# Patient Record
Sex: Female | Born: 1985
Health system: Western US, Academic
[De-identification: ages and names within clinical notes are randomized; demographics above are authoritative.]

---

## 2016-02-07 DIAGNOSIS — R768 Other specified abnormal immunological findings in serum: Secondary | ICD-10-CM

## 2016-03-03 DIAGNOSIS — M359 Systemic involvement of connective tissue, unspecified: Secondary | ICD-10-CM

## 2016-09-25 ENCOUNTER — Telehealth: Payer: BLUE CROSS/BLUE SHIELD

## 2016-10-26 ENCOUNTER — Ambulatory Visit: Payer: BLUE CROSS/BLUE SHIELD

## 2016-10-30 ENCOUNTER — Ambulatory Visit: Payer: BLUE CROSS/BLUE SHIELD

## 2016-11-30 ENCOUNTER — Ambulatory Visit: Payer: BLUE CROSS/BLUE SHIELD | Attending: Rheumatology

## 2016-11-30 DIAGNOSIS — J45990 Exercise induced bronchospasm: Secondary | ICD-10-CM

## 2016-11-30 DIAGNOSIS — E559 Vitamin D deficiency, unspecified: Secondary | ICD-10-CM

## 2016-12-09 ENCOUNTER — Ambulatory Visit: Payer: BLUE CROSS/BLUE SHIELD

## 2016-12-16 ENCOUNTER — Ambulatory Visit: Payer: BLUE CROSS/BLUE SHIELD

## 2016-12-16 DIAGNOSIS — R300 Dysuria: Secondary | ICD-10-CM

## 2016-12-16 MED ORDER — SOLIFENACIN SUCCINATE 5 MG PO TABS
5 mg | ORAL_TABLET | Freq: Every day | ORAL | 3 refills | Status: AC
Start: 2016-12-16 — End: ?

## 2016-12-16 MED ORDER — PHENAZOPYRIDINE HCL 200 MG PO TABS
200 mg | ORAL_TABLET | Freq: Three times a day (TID) | ORAL | 3 refills | Status: AC
Start: 2016-12-16 — End: 2017-06-30

## 2016-12-16 MED ORDER — HYDROXYZINE HCL 25 MG PO TABS
25 mg | ORAL_TABLET | Freq: Every evening | ORAL | 3 refills | Status: AC | PRN
Start: 2016-12-16 — End: ?

## 2017-01-07 ENCOUNTER — Ambulatory Visit: Payer: BLUE CROSS/BLUE SHIELD

## 2017-01-27 ENCOUNTER — Ambulatory Visit: Payer: BLUE CROSS/BLUE SHIELD

## 2017-02-04 ENCOUNTER — Ambulatory Visit: Payer: BLUE CROSS/BLUE SHIELD

## 2017-03-03 ENCOUNTER — Ambulatory Visit: Payer: BLUE CROSS/BLUE SHIELD | Attending: Rheumatology

## 2017-03-05 ENCOUNTER — Ambulatory Visit: Payer: BLUE CROSS/BLUE SHIELD | Attending: Rheumatology

## 2017-03-05 DIAGNOSIS — K219 Gastro-esophageal reflux disease without esophagitis: Secondary | ICD-10-CM

## 2017-03-05 DIAGNOSIS — R300 Dysuria: Secondary | ICD-10-CM

## 2017-03-05 DIAGNOSIS — E559 Vitamin D deficiency, unspecified: Secondary | ICD-10-CM

## 2017-03-05 MED ORDER — HYDROXYCHLOROQUINE SULFATE 200 MG PO TABS
200 mg | ORAL_TABLET | Freq: Every day | ORAL | 2 refills | Status: AC
Start: 2017-03-05 — End: 2017-06-12

## 2017-06-08 ENCOUNTER — Ambulatory Visit: Payer: BLUE CROSS/BLUE SHIELD | Attending: Rheumatology

## 2017-06-08 NOTE — Progress Notes
Chief Complaint: ???Undifferentiated connective tissue disease  ???  This is a ???30 y.o.???female??????Has been pretty good. No side effects from Plaquenil. Macular imaging done September and was normal. No more chest pain that had been reminiscent of 2017. Moving to [place] in March 2019 as husband has taken a job there for 2 to 3 years.     Onset chest pains, generalized pains, carpal tunnel, abdominal pain, bloating in February 2017. Has started gluten-free diet. Had an endoscopy in [facility] in [place] was normal. Saw Dr Beverely Pace who ordered an ultrasound. Dr Beverely Pace has told her she has a hiatal hernia and some reflux. Saw Dr Erle Crocker in August 2017 for positive ANA. Went to physical therapy. It helped. She started 50,000 units vitamin D end of February. She stopped it when it ran out in May 2018. In January, she tried to stop the Plaquenil. Within a month, she noticed more chest pain and resumed it. Plaquenil risks include (including and not limited to) rash, nausea, vomiting, neuromyopathy, and retinal toxicity (eye problems leading to vision loss). Patients should get a???baseline and annual eye exam to evaluate for Plaquenil (also known as hydroxychloroquine) toxicity.    ???  Saw an allergist about 2017. A few environmental allergens were found. Told to take nasal sprays. She gets bouts of nasal discharge. She has tried Claritan OTC and Benadryl at night and does not help.      This year, 2018, developed three UTIs and colds, more than usual for her. She saw a urologist. He said she had cystitis. By the time she saw him, the symptoms were gone. She saw a nephrologist once for lots of bubbles in urine. It was normal.   ???  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 Travels to some remote places.   ???  Lab 11/30/2016  Negative CRP. ESR 9 mm/hr  Normal CBC, CMP except decreased creatinine 0.48   Vitamin D 33 (20-50)  07/27/2016 ???Normal CRP, CBC, CMP, C3, C4. Negative dsDNA, total protein/creat. Vitamin D 15 ???(20-50). ESR 3 mm/hr (<25)   ???  Past Medical History:   Diagnosis Date   ??? Exercise-induced asthma     wheexes when exercises   ??? IBS (irritable bowel syndrome)     onset 07/2015   ??? Insomnia        Past Surgical History:   Procedure Laterality Date   ??? G0P0     ??? wisdom teeth         Social History     Social History   ??? Marital status: Married     Spouse name: N/A   ??? Number of children: N/A   ??? Years of education: N/A     Social History Main Topics   ??? Smoking status: Never Smoker   ??? Smokeless tobacco: Never Used   ??? Alcohol use 2.4 oz/week     4 Glasses of Wine (5 oz) per week      Comment: 2-4 drinks/week   ??? Drug use: Yes      Comment: marijuanna once a month   ??? Sexual activity: Yes      Comment: nuvaring     Other Topics Concern   ??? None     Social History Scientist, water quality    No kids       Family History   Problem Relation Age of Onset   ??? Lymphoma Mother 82   ??? Colon cancer Maternal Aunt 48   ???  Celiac disease Maternal Aunt    ??? Colon cancer Maternal Uncle 55   ??? Celiac disease Cousin    ??? Celiac disease Cousin        No Known Allergies    Current Outpatient Prescriptions   Medication Sig   ??? diclofenac 1% gel Apply 2 g topically four (4) times daily.   ??? hydroxychloroquine (PLAQUENIL) 200 mg tablet Take 1 tablet (200 mg total) by mouth daily.   ??? NUVARING 0.12-0.015 MG/24HR vaginal ring    ??? phenazopyridine (PYRIDIUM) 200 mg tablet Take 1 tablet (200 mg total) by mouth three (3) times daily with meals.   ??? zolpidem 5 mg tablet Take 5 mg by mouth at bedtime as needed for Sleep.   ??? ergocalciferol 50000 units capsule Take 1 capsule (50,000 Units total) by mouth every seven (7) days. (Patient not taking: Reported on 09/15/2016.)     No current facility-administered medications for this visit.        Patient Active Problem List    Diagnosis Date Noted   ??? GERD (gastroesophageal reflux disease) 03/05/2017   ??? Exercise-induced asthma      wheexes when exercises ??? Undifferentiated connective tissue disease (HCC/RAF) 03/03/2016   ??? Costochondral chest pain      Ibuprofen as needed       ??? Wheezing 02/09/2016   ??? ANA positive 02/07/2016       REVIEW OF SYSTEMS:  14 point review of systems other than discussed above or in the rheumatology follow up visit questionnaire was negative.    Vitals:    06/08/17 0919   BP: 94/60   Pulse: 93   Temp: 36.9 ???C (98.4 ???F)   TempSrc: Oral   SpO2: 98%   Weight: 129 lb 9.6 oz (58.8 kg)     21.90 kg/m???  PHYSICAL EXAM:  Was 5' 5''??????at 31 years of age  HEENT: ???PERRLA. Pharynx clear. Normal saliva pool. No adenopathy.  LUNGS: ???Clear to auscultation. No wheezes, rhonchi, or rubs  CARDIO: ??????S1S2 no murmurs, gallops, or rubs.  ABDOMINAL: ???Normal bowel sounds, soft. Nontender. No hepatomegaly.  PULSES: ???Full. No bruits  NEURO: ??????Right handed. Motor 5/5. Normal light touch. DTR symmetrical. Negative Tinel's.   MUSCULOSKELETAL: ???No active synovitis  Skin: ???Tattoos none. No nail changes.  ???  ???  ASSESSMENT & PLAN  1. Exercise-induced asthma  Care per PCP  ???  2. Undifferentiated connective tissue disease  Printed patient information on Plaquenil on 11/30/2016.   Draw blood today and one week before return visit  See me 3 months  ???  3. Vitamin D deficiency  Resume vitamin D  ???  4. GERD  See Dr Beverely Pace if symptoms worsen  ???  25 minutes were spent with the patient. Greater than 50% of the office visit time was devoted to counseling the patient, reviewing previous tests, discussing treatment options, and developing a follow up plan. The above plan of care, diagnosis, orders, and follow-up were discussed with the patient.??????Questions related to this recommended plan of care were answered.  ???  ???

## 2017-06-10 ENCOUNTER — Telehealth: Payer: BLUE CROSS/BLUE SHIELD

## 2017-06-10 NOTE — Telephone Encounter
Please let patient know her labs are pretty good. Continue current meds without change and have a happy holiday. Thanks

## 2017-06-11 MED ORDER — HYDROXYCHLOROQUINE SULFATE 200 MG PO TABS
ORAL_TABLET | 2 refills | Status: AC
Start: 2017-06-11 — End: 2017-08-21

## 2017-06-11 NOTE — Telephone Encounter
I spoke with pt, pt understood and agreed.     Thank you    eh

## 2017-06-28 ENCOUNTER — Ambulatory Visit: Payer: BLUE CROSS/BLUE SHIELD | Attending: Cardiovascular Disease

## 2017-06-28 NOTE — Progress Notes
Referring MD: Donna Bernard, MD   HPI:   Felicia Kennedy is a 32 y.o. female  has a past medical history of Exercise-induced asthma; IBS (irritable bowel syndrome); and Insomnia.. The patient is referred for chest pain.  Prior cardiac history: None  Has had intermittent chest pain since 07/2015. Feels ''deep'' but also has pain she can reproduce with palpation.  Notes occasional wheezing at night with expiration that can last all night- high pitched sound.  Notes wheezing with exercise as well- saw a pulmonologist who said she didn't have asthma. Able to continue exercise- can run 3 miles without stopping.    Started plaquenil 1 month ago.  Significantly improved chest pain since then. Now symptoms last an hour, rated 5/10, located in center of chest and right sided chest wall pain.   Notes need to take a deep breath occasionally.  Exercise Capacity: 2-3 times per week- runs, hikes, yoga    No paroxysmal nocturnal dyspnea or orthopnea.  No lower extremity edema.    INTERVAL HISTORY 06/29/2017:  Here for follow-up.  Has occasional chest pain when she has flare of UCTD. Feels like aching or stabbing sensation for a couple seconds at a time. No identifiable triggers. Runs 35 minutes at a time without chest pain. Has exercise induced asthma with cough and wheezing.  Following gluten free diet.     ROS: 14 point review of systems is negative for pertinent findings other than as stated in HPI    HOME MEDICATIONS:  Medications that the patient states to be currently taking   Medication Sig   ??? ergocalciferol 50000 units capsule Take 1 capsule (50,000 Units total) by mouth every seven (7) days.   ??? HYDROXYCHLOROQUINE 200 mg tablet TAKE 1 TABLET BY MOUTH EVERY DAY   ??? NUVARING 0.12-0.015 MG/24HR vaginal ring    ??? zolpidem 5 mg tablet Take 5 mg by mouth at bedtime as needed for Sleep.       ALLERGIES:  No Known Allergies    Past Medical History:   Diagnosis Date   ??? Exercise-induced asthma     wheexes when exercises ??? IBS (irritable bowel syndrome)     onset 07/2015   ??? Insomnia         Past Surgical History:   Procedure Laterality Date   ??? G0P0     ??? wisdom teeth          SOCIAL HISTORY  History   Smoking Status   ??? Never Smoker   Smokeless Tobacco   ??? Never Used      History   Alcohol Use   ??? 2.4 oz/week   ??? 4 Glasses of Wine (5 oz) per week     Comment: 2-4 drinks/week     History   Drug Use     Comment: marijuanna once a month     Marital status: married  Occupation: Engineer, agricultural    FAMILY HISTORY:  Premature CAD: no  Sudden Cardiac Death: no    PHYSICAL EXAM:  Vitals: BP 124/74  ~ Pulse 92  ~ Ht 5' 4.5'' (1.638 m)  ~ Wt 132 lb (59.9 kg)  ~ SpO2 99%  ~ BMI 22.31 kg/m???   ??? General appearance ??? well developed, well nourished, no acute distress  ??? Eyes - conjunctivae and lids within normal limits  ??? Ears, Nose, Mouth, Throat - no mucosal lesions, no cyanosis  ??? Neck - JVD not elevated  ??? Cardiovascular  ??? Palpation - PMI not  displaced, no thrills  ??? Regular rate and rhythm. Normal S1, S2. No S3 or S4 gallops. No murmurs.  ??? Carotid arteries ??? normal upstroke, no bruits  ??? Abdominal aorta ???difficult to palpate, no bruits  ??? Femoral arteries - 2+ pulses, no bruits  ??? 2+ dorsalis pedis pulses  ??? Respiratory - no respiratory distress, clear to auscultation bilaterally  ??? GI - soft, nontender, no masses, no hepatosplenomegaly, normoactive bowel sounds  ??? Musculoskeletal - grossly normal motor tone  ??? Extremities - no clubbing or cyanosis; no edema  ??? Skin - no rashes  ??? Neurological/Psychiatric - oriented x 3, normal mood and affect    PRIOR CARDIAC STUDIES:  Echocardiogram 03/2016:  IMPRESSION:   1. Technically difficult study.   2. Normal left ventricular size.   3. Left ventricular ejection fraction is approximately 55 to 60%.   4. Normal LV diastolic function    Stress echocardiogram 07/2016:  STRESS EXAM DESCRIPTION:  Stress echocardiogram was performed using the Accelerated Bruce Protocol. The patient exercised for 7 min and 02 sec to stage III, achieving 10.1 METs.  The peak heart rate achieved was 176 bpm, which was 93 % of the age predicted max heart rate. The peak blood pressure during stress was 139/63 mmHg. The double product achieved was 21308.     BASELINE:  The patient's baseline blood pressure was normal. The resting blood pressure was 94/60 mmHg.The resting heart rate was 77 beats per minute. The baseline rhythm was normal sinus rhythm. There were no arrhythmias. Non-specific ST segment and T wave   abnormalities were noted at baseline.     ADDITIONAL BASELINE FINDINGS:  LEFT VENTRICLE: Normal left ventricular size. Global left ventricular systolic function is normal (LVEF 60-65%). Global systolic function is normal, LVEF 60 to 65%.  STRESS:  The patient experienced no significant symptoms. The primary reason for test termination was achievement of target heart rate. The blood pressure response to stress was normal. The heart rhythm during stress was sinus tachycardia. There were no   arrhythmias. Non-specific ST and T wave abnormalities developed with stress.     Global left ventricular function increased appropriately with stress. No new segmental wall motion abnormalities were seen. Global left ventricular systolic function at peak stress is normal (LVEF 70-75%).     RECOVERY  No significant ST segment or T wave changes developed during recovery. Heart rate recovery, at one minute into cool down period, was normal (>12 bpm).     SUMMARY:   1. Good exercise tolerance.   2. Stress test was adequate for inducing target heart rate and/or exercise response.   3. Heart rate response to stress was adequate (>85% MPHR).   4. Patient's symptoms were not suggestive of ischemia.   5. ECG findings are not suggestive of ischemia.   6. Echocardiogram is not suggestive of ischemia.   7. Study is negative for ischemia and suggests a low probability of obstructive coronary artery disease. LABORATORY DATA:   hsCRP (Cardio CRP) and CV Risk   Order: [id number]   Collected:  06/08/2017 10:03 Status:  Final result    Ref Range & Units 2wk ago   hsCRP (Cardio CRP) and CV Risk ??????mg/L 21.8     Comment: Reference Range:   Low risk: ??? ??? ???<1.0 mg/L   Average risk: ???1.0-3.0 mg/L   High risk: ??? ??? >3.0 mg/L             No results found for: CHOL, TRIGLY, CHOLHDL, CHOLDLCAL,  [facility]  Lab Results   Component Value Date    WBC 7.85 06/08/2017    HGB 13.8 06/08/2017    HCT 41.5 06/08/2017    MCV 95.6 06/08/2017    PLT 297 06/08/2017     Lab Results   Component Value Date    NA 140 06/08/2017    K 4.2 06/08/2017    CL 102 06/08/2017    CO2 26 06/08/2017    BUN 7 06/08/2017    CREAT 0.53 (L) 06/08/2017    GLUCOSE 111 (H) 06/08/2017     Lab Results   Component Value Date    ALT 16 06/08/2017     Lab Results   Component Value Date    TSH 2.7 01/21/2016     No results found for: [id number]  No results found for: BNP   Lab Results   Component Value Date    HGBA1C 5.4 01/21/2016       2013 ACC/AHA guideline recommends recommends that patient is not in statin benefit group. Encourage adherence to heart-healthy lifestyle.    10-year ASCVD risk  cannot be calculated because at least one required variable is not available in [facility]  as of 10:15 AM on 06/29/2017  Values used to calculate ASCVD score:  Age: 32 y.o. Cannot calculate risk because age is not between 80 and 11 years old.  Gender: Female Race: Not African American.  Cannot calculate risk because HDL cholesterol not documented within the past 5 years.    Cannot calculate risk because Total cholesterol not documented within the past 5 years.    LDL cholesterol not documented within the past 5 years.    Systolic BP: 124 mm Hg. BP was measured today.  The patient is not being treated with a medication that influences SBP.  The patient is currently not a smoker.  The patient does not have a diagnosis of diabetes. Click here for the 2013 ACC/AHA Cardiovascular Risk Estimator tool Office manager).    EKG 06/29/2016: normal sinus rhythm 73  EKG 06/29/2017: normal sinus rhythm 79    IMPRESSION:  Felicia Kennedy is a 32 y.o. female with:   1. History of atypical chest pain - not pericarditic (not pleuritic, not positional); improved with plaquenil; likely component of costochondritis- had negative stress test and echocardiogram   2. ANA positive, undifferentiated CTD  3. Elevated hs-CRP- had flare of UCTD and a cold when she had blood test in 05/2017    PLAN:  >> baseline treadmill stress test results reviewed with patient from last year; current intermittent symptoms are atypical and reassuring in that she can run 35 minutes without chest pain; no further work-up indicated at this time  >> repeat hs-CRP when not having inflammatory symptoms   >> I had an extensive discussion with the patient regarding risk factor modification. I emphasized the importance of daily exercise and a heart healthy diet. The goal LDL is less than 100 (and if possible < 70) and the goal HDL is above 40. The goal BP is less than 130/80.   >> return to clinic as needed- moving to [place] in march

## 2017-06-29 ENCOUNTER — Ambulatory Visit: Payer: BLUE CROSS/BLUE SHIELD | Attending: Cardiovascular Disease

## 2017-06-29 DIAGNOSIS — M359 Systemic involvement of connective tissue, unspecified: Secondary | ICD-10-CM

## 2017-06-29 DIAGNOSIS — R079 Chest pain, unspecified: Secondary | ICD-10-CM

## 2017-06-29 DIAGNOSIS — R7982 Elevated C-reactive protein (CRP): Secondary | ICD-10-CM

## 2017-08-07 ENCOUNTER — Ambulatory Visit: Payer: BLUE CROSS/BLUE SHIELD

## 2017-08-11 ENCOUNTER — Ambulatory Visit: Payer: BLUE CROSS/BLUE SHIELD

## 2017-08-11 DIAGNOSIS — R071 Chest pain on breathing: Secondary | ICD-10-CM

## 2017-08-11 MED ORDER — DICLOFENAC SODIUM 1 % TD GEL
4 g | Freq: Four times a day (QID) | TOPICAL | 2 refills | Status: AC
Start: 2017-08-11 — End: ?

## 2017-08-19 ENCOUNTER — Ambulatory Visit: Payer: BLUE CROSS/BLUE SHIELD | Attending: Rheumatology

## 2017-08-20 ENCOUNTER — Ambulatory Visit: Payer: BLUE CROSS/BLUE SHIELD | Attending: Rheumatology

## 2017-08-20 MED ORDER — ZOLPIDEM TARTRATE 5 MG PO TABS
5 mg | ORAL_TABLET | Freq: Every evening | ORAL | 0 refills | Status: AC | PRN
Start: 2017-08-20 — End: ?

## 2017-08-20 MED ORDER — HYDROXYCHLOROQUINE SULFATE 200 MG PO TABS
ORAL_TABLET | 0 refills | Status: AC
Start: 2017-08-20 — End: ?

## 2017-08-20 NOTE — Progress Notes
Chief Complaint: ???Undifferentiated connective tissue disease  ???  This is a ???32 y.o.???female??????Has been pretty good. No side effects from Plaquenil. Macular imaging done September and was normal. No more chest pain that had been reminiscent of 2017. She feels it very infrequently. Moving to [place] in March 2019 as husband has taken a job there for 2 to 3 years.   ???  Is having a bit of knee and leg discomfort. Nothing that affects her function. Onset chest pains, generalized pains, carpal tunnel, abdominal pain, bloating in February 2017. Has started gluten-free diet. Had an endoscopy in [facility] in [place] was normal. Saw Dr Beverely Pace who ordered an ultrasound. Dr Beverely Pace has told her she has a hiatal hernia and some reflux. Saw Dr Erle Crocker in August 2017 for positive ANA. Went to physical therapy. It helped. She started 50,000 units vitamin D end of February. She stopped it when it ran out in May 2018. In January, she tried to stop the Plaquenil. Within a month, she noticed more chest pain and resumed it. Plaquenil risks include (including and not limited to) rash, nausea, vomiting, neuromyopathy, and retinal toxicity (eye problems leading to vision loss). Patients should get a???baseline and annual eye exam to evaluate for Plaquenil (also known as hydroxychloroquine) toxicity. ???  ???  Saw an allergist about 2017. A few environmental allergens were found. Told to take nasal sprays. She gets bouts of nasal discharge. She has tried Claritan OTC and Benadryl at night and does not help.   ???  In 2018, developed three UTIs and colds, more than usual for her. She saw a???urologist. He said she had cystitis. By the time she saw him, the symptoms were gone. She saw a nephrologist once for lots of bubbles in urine. It was normal.   ???  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 Travels to some remote places.   ???  Lab 11/30/2016 ???Negative CRP. ESR 9 mm/hr ???Normal CBC, CMP except decreased creatinine 0.48 ??????Vitamin D 33 (20-50)  07/27/2016 ???Normal CRP, CBC, CMP, C3, C4. Negative dsDNA, total protein/creat. Vitamin D 15 ???(20-50). ESR 3 mm/hr (<25)   ???    Past Medical History:   Diagnosis Date   ??? CRP elevated 06/29/2017   ??? Exercise-induced asthma     wheexes when exercises   ??? IBS (irritable bowel syndrome)     onset 07/2015   ??? Insomnia        Past Surgical History:   Procedure Laterality Date   ??? G0P0     ??? wisdom teeth         Social History     Social History   ??? Marital status: Married     Spouse name: N/A   ??? Number of children: N/A   ??? Years of education: N/A     Social History Main Topics   ??? Smoking status: Never Smoker   ??? Smokeless tobacco: Never Used   ??? Alcohol use 2.4 oz/week     4 Glasses of Wine (5 oz) per week      Comment: 2-4 drinks/week   ??? Drug use: Yes      Comment: marijuanna once a month   ??? Sexual activity: Yes      Comment: nuvaring     Other Topics Concern   ??? 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 Lymphoma Mother 44   ??? Colon cancer Maternal Aunt 79   ??? Celiac disease Maternal Aunt    ??? Colon cancer Maternal Uncle 91   ??? Celiac disease Cousin    ??? Celiac disease Cousin        No Known Allergies    Current Outpatient Prescriptions   Medication Sig   ??? diclofenac 1% gel Apply 4 g topically four (4) times daily Apply to affected areas.   ??? ergocalciferol 50000 units capsule Take 1 capsule (50,000 Units total) by mouth every seven (7) days.   ??? HYDROXYCHLOROQUINE 200 mg tablet TAKE 1 TABLET BY MOUTH EVERY DAY   ??? NUVARING 0.12-0.015 MG/24HR vaginal ring    ??? zolpidem 5 mg tablet Take 5 mg by mouth at bedtime as needed for Sleep.   ??? diclofenac 1% gel Apply 2 g topically four (4) times daily. (Patient not taking: Reported on 06/29/2017.)     No current facility-administered medications for this visit.        Patient Active Problem List    Diagnosis Date Noted   ??? CRP elevated 06/29/2017 ??? GERD (gastroesophageal reflux disease) 03/05/2017   ??? Exercise-induced asthma      wheexes when exercises     ??? Undifferentiated connective tissue disease (HCC/RAF) 03/03/2016   ??? Costochondral chest pain      Ibuprofen as needed       ??? Wheezing 02/09/2016   ??? ANA positive 02/07/2016     REVIEW OF SYSTEMS:  14 point review of systems other than discussed above or in the rheumatology follow up visit questionnaire was negative.    Vitals:    08/20/17 0921   BP: 94/60   Pulse: 79   Temp: 36.8 ???C (98.2 ???F)   TempSrc: Oral   SpO2: 97%   Weight: 129 lb 6.4 oz (58.7 kg)   Height: 5' 5.7'' (1.669 m)     21.08 kg/m???  PHYSICAL EXAM:  Was 5' 5''??????at 32 years of age  HEENT: ???PERRLA. Pharynx clear. Normal saliva pool. No adenopathy.  LUNGS: ???Clear to auscultation. No wheezes, rhonchi, or rubs  CARDIO: ??????S1S2 no murmurs, gallops, or rubs.  ABDOMINAL: ???Normal bowel sounds, soft. Nontender. No hepatomegaly.  PULSES: ???Full. No bruits  NEURO: ??????Right handed. Motor 5/5. Normal light touch. DTR symmetrical. Negative Tinel's.   MUSCULOSKELETAL: ???No active synovitis  Skin: ???Tattoos none. No nail changes.  ???  ???  ASSESSMENT & PLAN  1. Exercise-induced asthma  Care per PCP  ???  2. Undifferentiated connective tissue disease  Printed patient information on Plaquenil on 11/30/2016.   Refilled 3 months of Plaquenil   Get eye check every year  See me 3 years  ???  3. Vitamin D deficiency  Take vitamin D  ???  4. GERD  See Dr Beverely Pace if symptoms worsen  ???    25 minutes were spent with the patient. Greater than 50% of the office visit time was devoted to counseling the patient, reviewing previous tests, discussing treatment options, and developing a follow up plan. The above plan of care, diagnosis, orders, and follow-up were discussed with the patient.??????Questions related to this recommended plan of care were answered.  ???  ???

## 2017-08-23 ENCOUNTER — Ambulatory Visit: Payer: BLUE CROSS/BLUE SHIELD

## 2017-08-23 ENCOUNTER — Telehealth: Payer: BLUE CROSS/BLUE SHIELD

## 2017-08-23 NOTE — Telephone Encounter
Please call patient and tell her I have looked at the hsCRP results as promised. It is in normal range, all be it on upper edge of the ''average risk range. She should discuss it further with dr Sherryll Burger. Also there are case reports describing biotin either causing an increased or decreased PTH level. The PTH should probably be checked again when she has not been on biotin for at least 3-4 days. Also, the endocrinologist suggested doing the PTH at [facility], not [facility] lab which is changing its methodology right nowThank you

## 2017-08-23 NOTE — Telephone Encounter
Message sent to pt via mychart portal (Pt Comm Pref: MyChart ) .   I also spoke with pt and advised, per pt she will review message on mychart.   I asked pt to please give [place] a call if she has any questions. I also advised pt that I would CC Dr.Shah in this message.     Thank you   eh

## 2019-07-01 IMAGING — MR MRI BRAIN W WO CONTRAST
11 of 13 series · 40 of 48 positions shown · IV contrast (agent unspecified)
Comparison: Head CT 06/30/2019

MRI BRAIN W WO CONTRAST
INDICATION: Headache, acute, normal neuro exam
TECHNIQUE: Following informed consent without and with contrast images

[Series 101: survey · axial · 10.0mm · 0.98mm/px · z∈[+20,+145]mm · 2 of 9 slices shown]
[im 1/9]
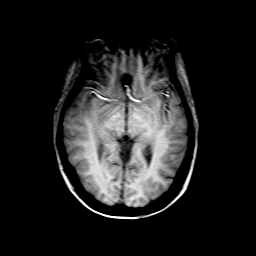
[im 9/9]
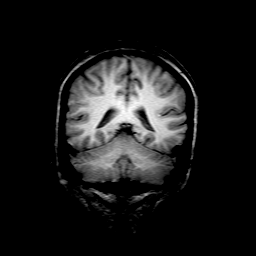

[Series 301: T1 · sagittal · 4.0mm · 0.62mm/px · 3 of 26 slices shown (1 of 2)]
[im 1/26]
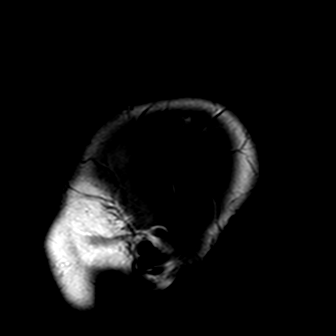
[im 13/26]
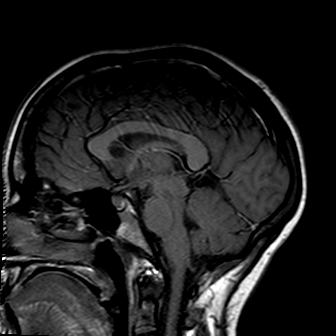
[im 26/26]
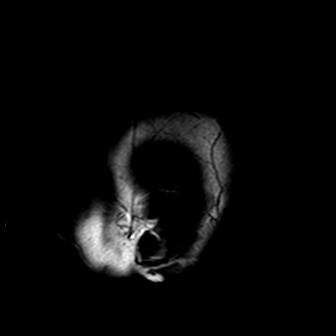

[Series 401: FLAIR · sagittal · 4.0mm · 0.94mm/px · 3 of 26 slices shown (1 of 2)]
[im 1/26]
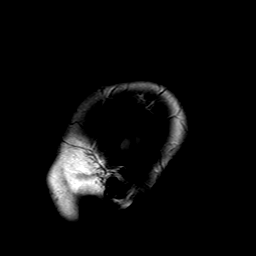
[im 13/26]
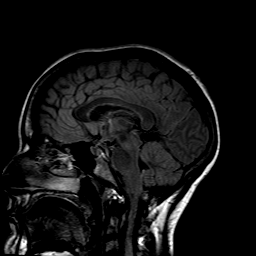
[im 26/26]
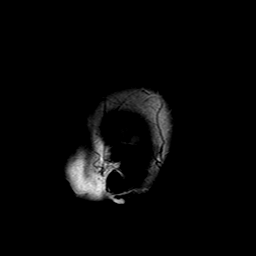

[Series 504: DWI · axial · 4.0mm · 0.90mm/px · z∈[-55,+100]mm · 4 of 32 slices shown]
[im 1/32]
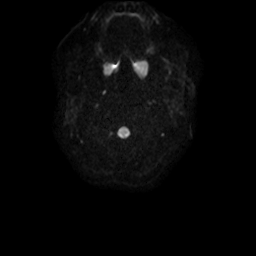
[im 11/32]
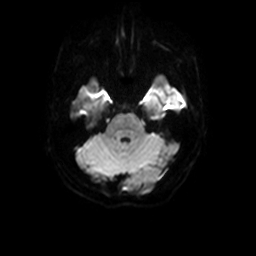
[im 21/32]
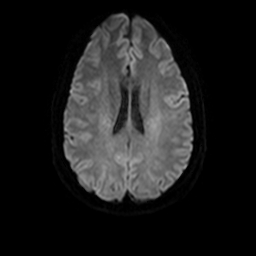
[im 32/32]
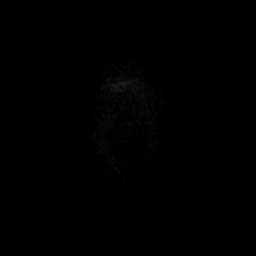

[Series 601: T2 · axial · 4.0mm · 0.55mm/px · z∈[-55,+100]mm · 4 of 32 slices shown]
[im 1/32]
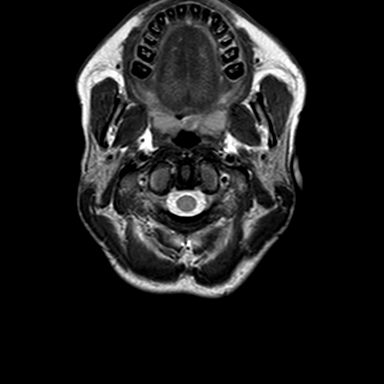
[im 11/32]
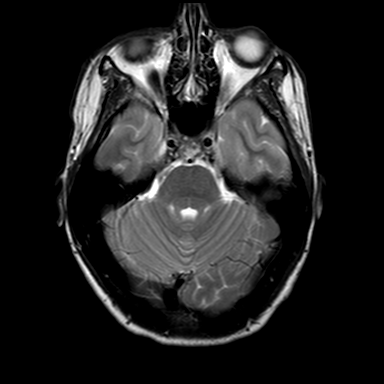
[im 21/32]
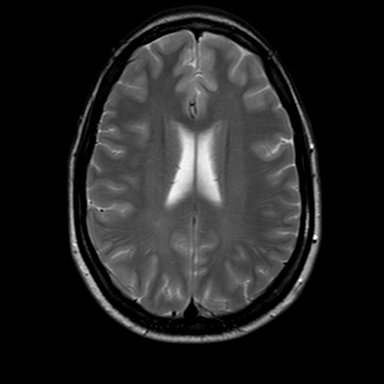
[im 32/32]
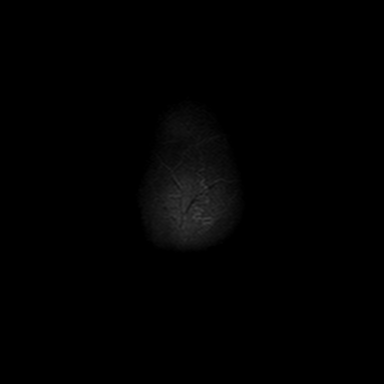

[Series 701: FLAIR · axial · 4.0mm · 0.47mm/px · z∈[-55,+100]mm · 4 of 32 slices shown (2 of 2)]
[im 1/32]
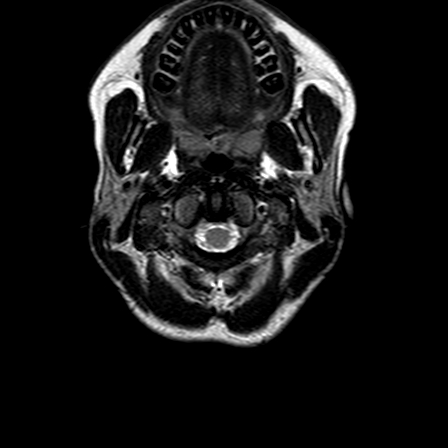
[im 11/32]
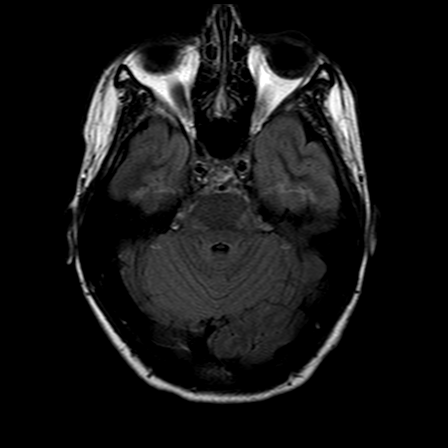
[im 21/32]
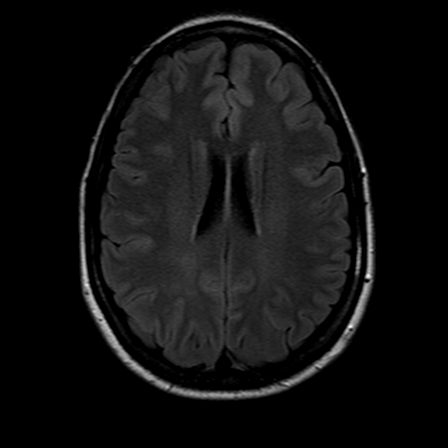
[im 32/32]
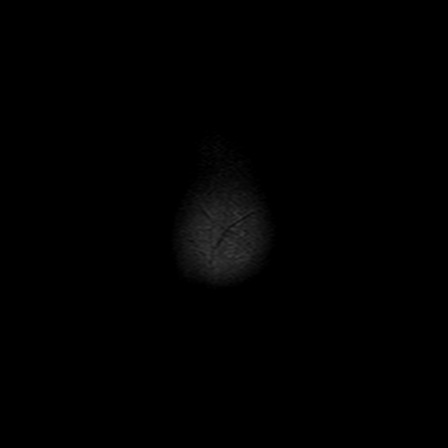

[Series 801: GRE · axial · 4.0mm · 0.88mm/px · z∈[-55,+100]mm · 4 of 32 slices shown]
[im 1/32]
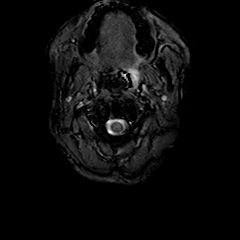
[im 11/32]
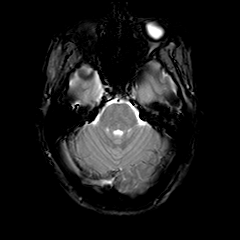
[im 21/32]
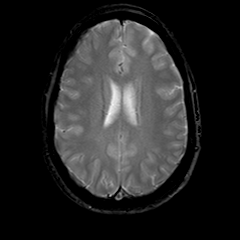
[im 32/32]
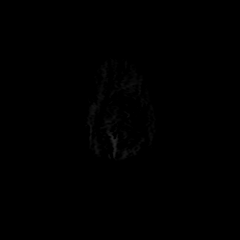

[Series 901: T1 · axial · 4.0mm · 0.55mm/px · z∈[-55,+100]mm · 4 of 32 slices shown (2 of 2)]
[im 1/32]
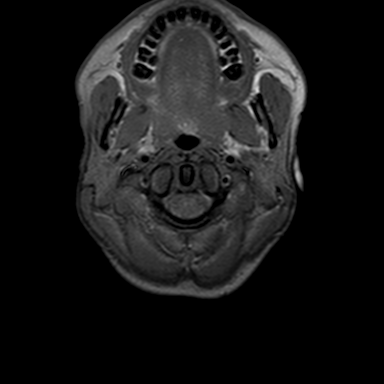
[im 11/32]
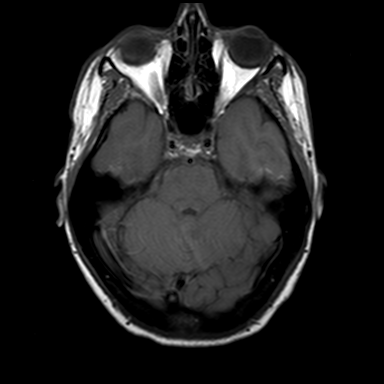
[im 21/32]
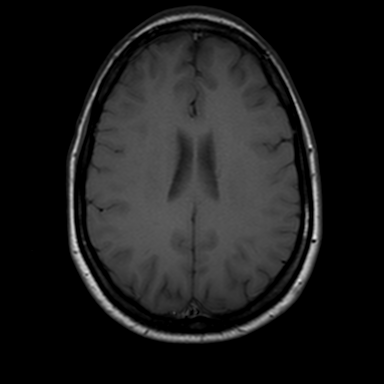
[im 32/32]
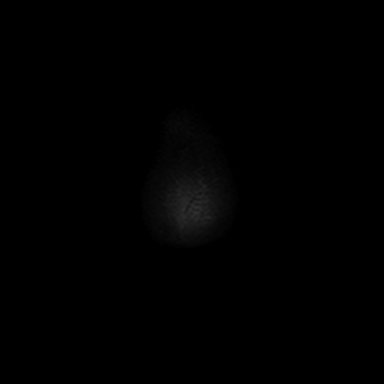

[Series 1001: T1 post-contrast · axial · 4.0mm · 0.55mm/px · z∈[-55,+100]mm · 4 of 32 slices shown (1 of 3)]
[im 1/32]
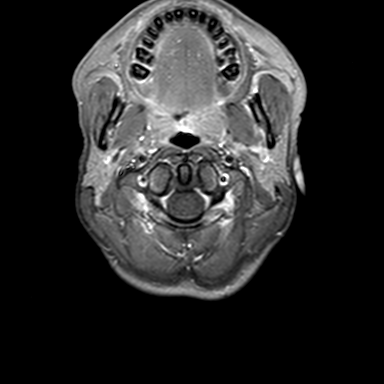
[im 11/32]
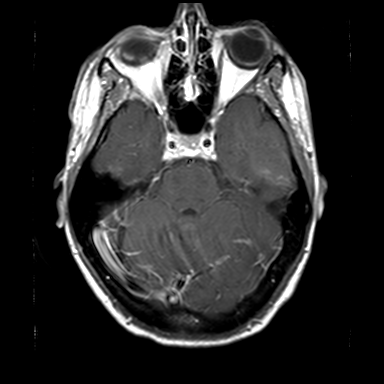
[im 21/32]
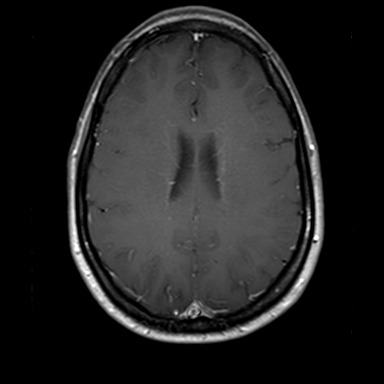
[im 32/32]
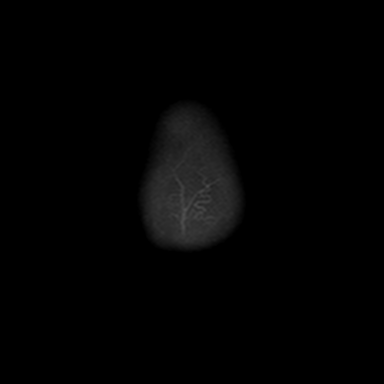

[Series 1101: T1 post-contrast · coronal · 4.0mm · 0.55mm/px · 5 of 36 slices shown (2 of 3)]
[im 1/36]
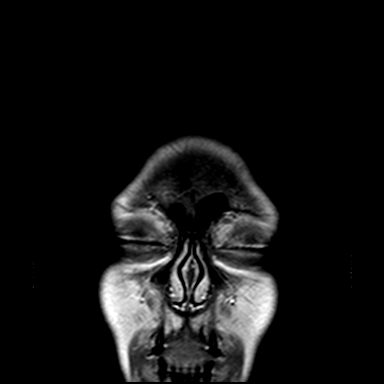
[im 9/36]
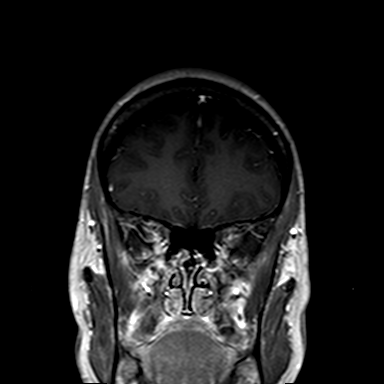
[im 18/36]
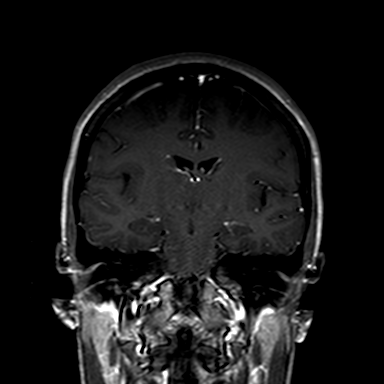
[im 27/36]
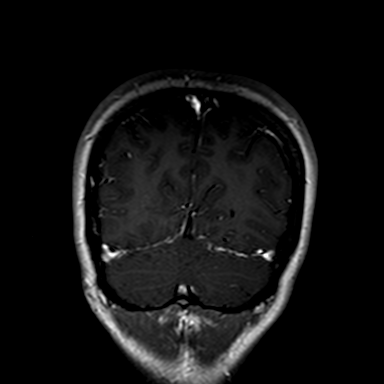
[im 36/36]
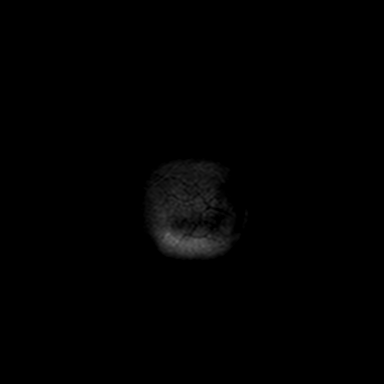

[Series 1201: T1 post-contrast · sagittal · 4.0mm · 0.62mm/px · 3 of 26 slices shown (3 of 3)]
[im 1/26]
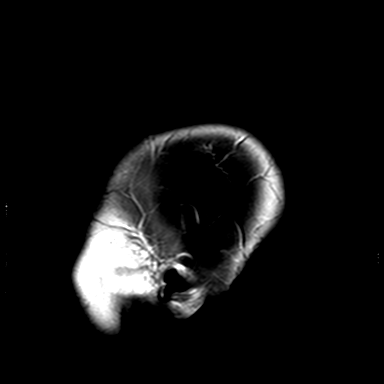
[im 13/26]
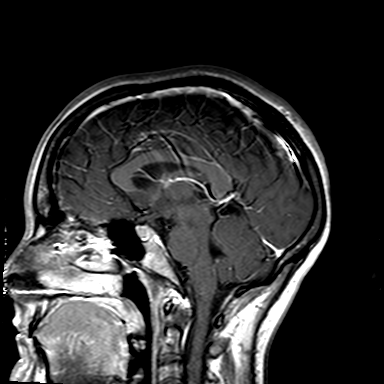
[im 26/26]
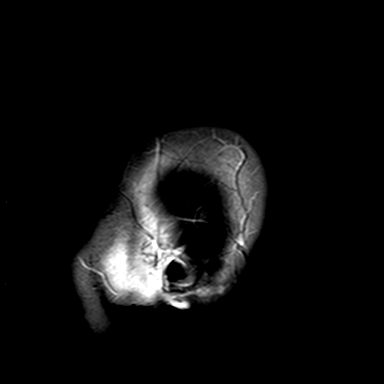

[40 of 48 positions shown; findings below may reference images not displayed]

FINDINGS: Sagittal T1 images demonstrates normal appearance of the sella cerebellar tonsils upper cervical spine and midline structures. Diffusion images demonstrates no evidence of abnormal signal to suggest diffusion restricted changes.
Axial T2 and FLAIR images demonstrates patency of the vertebrals basilar and basilar tip. The sinuses are clear. The medulla midbrain pons and posterior fossa is unremarkable. The major intracranial vessels are patent. The ventricles appear normal size and shape. FLAIR images demonstrates no periventricular white matter abnormality.
Axial T1 images demonstrates normal appearance infratemporal fossa clivus and skull base. The globes are normal. Gray-white differentiations unremarkable.
Gradient echo images demonstrates no evidence of abnormal blood products.
Following contrast repeat images demonstrates normal appearance of the sella parasellar regions and skull base. Pituitary gland measures 5.9 mm craniocaudad dimension without focal mass lesion.
There is no evidence of abnormal leptomeningeal or parenchymal enhancement
IMPRESSION: 
IMPRESSION: Age-appropriate MRI of the brain
Location: 1
Is the patient pregnant?
No

## 2019-10-22 IMAGING — CT CT CHEST PULMONARY EMBOLISM W IV CONTRAST
2 of 5 series · 13 of 36 positions shown · non-contrast
Comparison: none

Exam:CTA chest
REASON FOR EXAM: Back pain
TECHNIQUE: Contiguous 2.5 mm thick axial images were obtained through the chest during the pulmonary arterial phase. Informed consent was obtained prior to the administration of IV contrast.

[Series 2: pe chest · axial · 0.62mm/px · z∈[-290,-50]mm · 10 of 118 slices shown]
[im 11/118  lung]
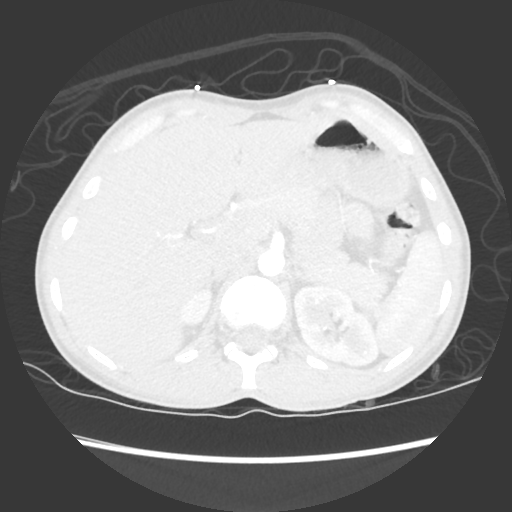
[im 22/118  mediastinal]
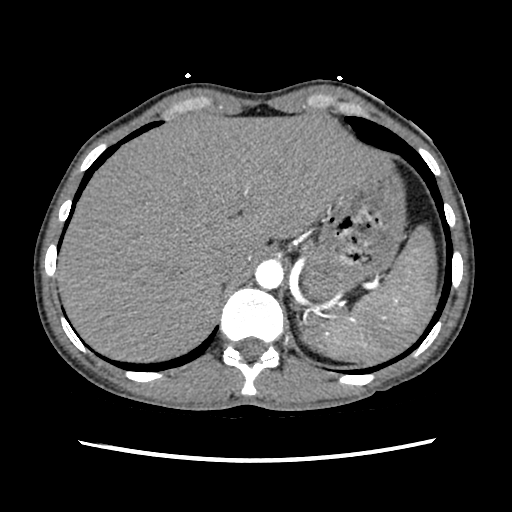
[im 32/118  lung]
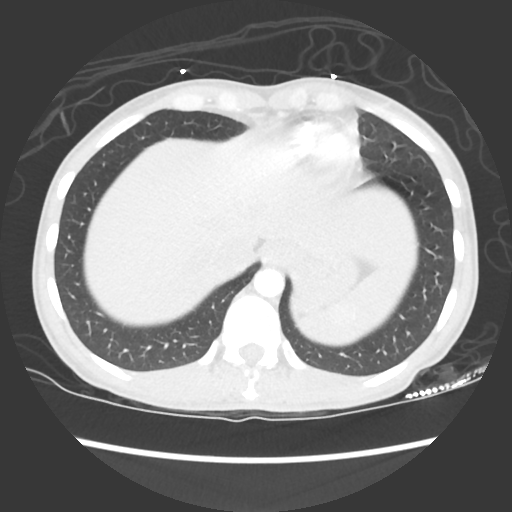
[im 43/118  mediastinal]
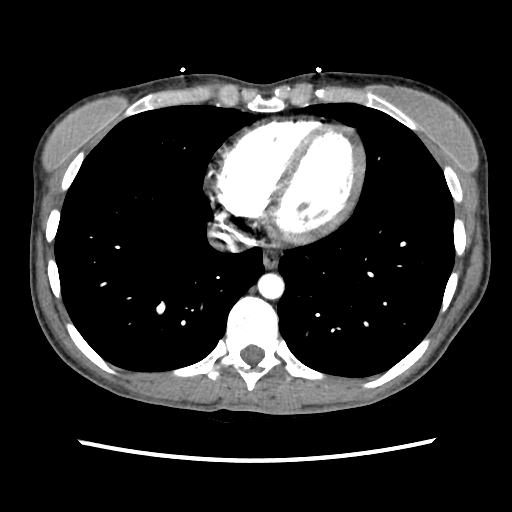
[im 54/118  lung]
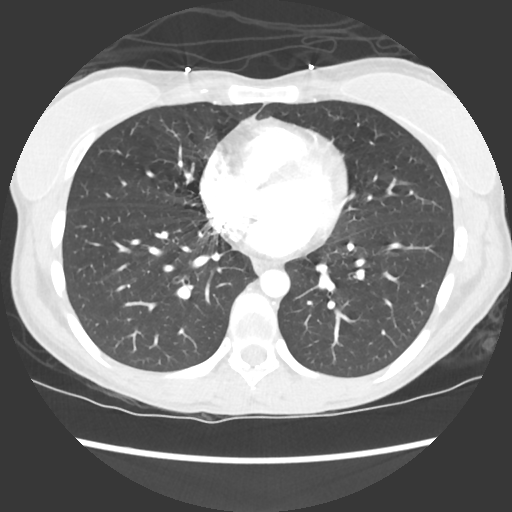
[im 64/118  mediastinal]
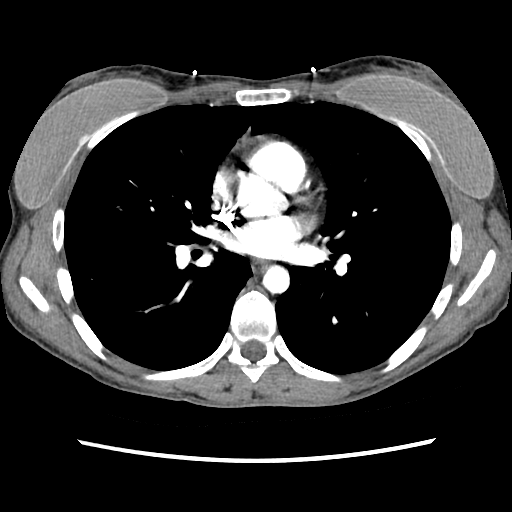
[im 75/118  lung]
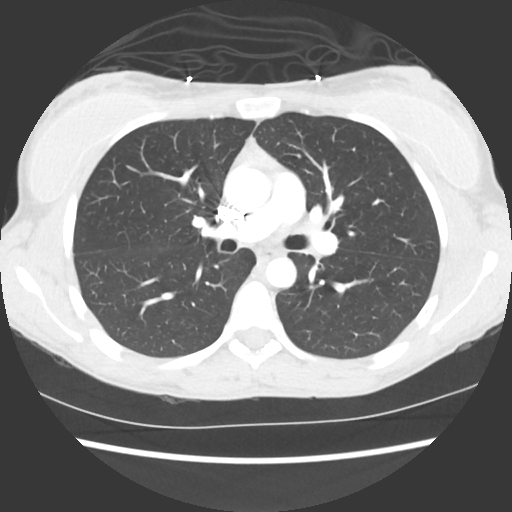
[im 86/118  mediastinal]
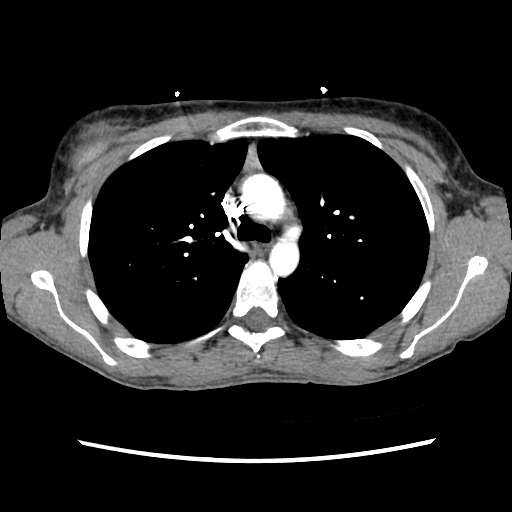
[im 96/118  lung]
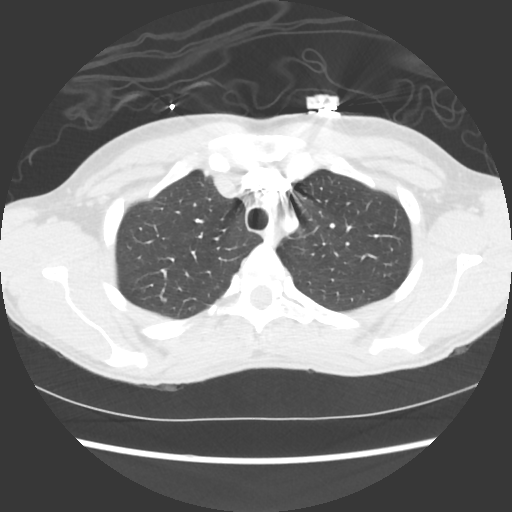
[im 107/118  mediastinal]
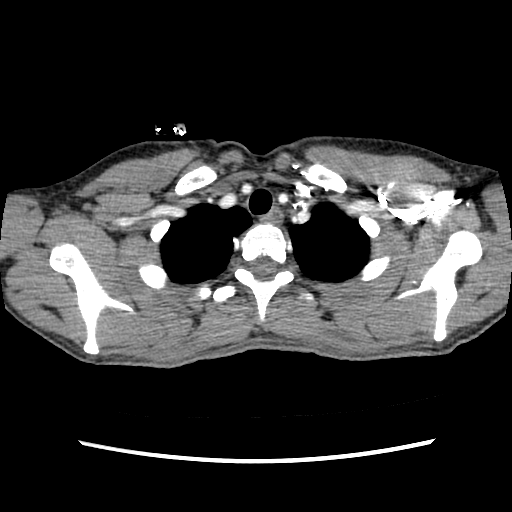

[Series 602: sag standard 2x2 · sagittal · 0.62mm/px · 3 of 160 slices shown]
[im 32/160  mediastinal]
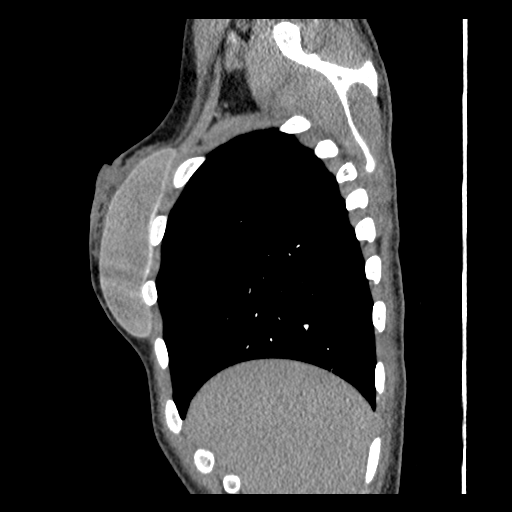
[im 64/160  mediastinal]
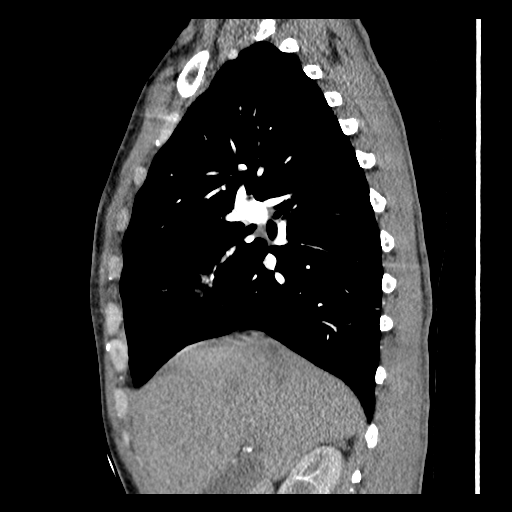
[im 96/160  mediastinal]
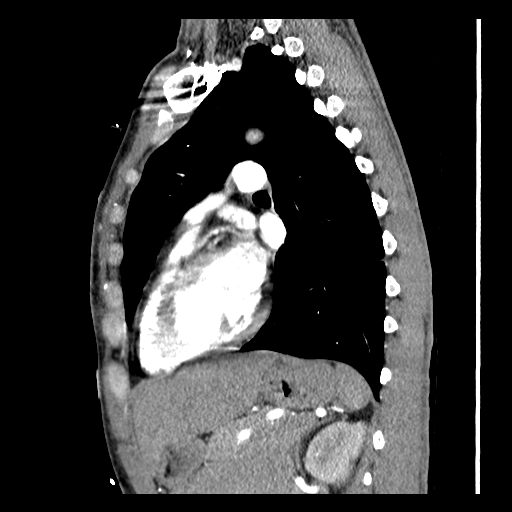

[13 of 36 positions shown; findings below may reference images not displayed]

FINDINGS: There is no mediastinal, hilar, or axillary lymphadenopathy by CT size criteria. There is no pleural or pericardial effusion. The heart is of normal size. The thoracic aorta is of normal caliber without evidence of dissection.
Evaluation of the lung parenchyma demonstrates normal attenuation. No focal airspace consolidation is seen. There is no evidence of bronchiectasis. No pulmonary mass is identified.
The upper abdomen is unremarkable to the extent visualized. No acute osseous abnormality is seen.
IMPRESSION: No evidence of significant intrathoracic pathology
Location:1
Is the patient pregnant?
Unknown

## 2019-10-22 IMAGING — CT CT ABDOMEN PELVIS W CONTRAST
2 of 4 series · 17 of 46 positions shown, 19 images · IV contrast (agent unspecified)
Comparison: none

Exam:CT abdomen and pelvis with IV contrast
REASON FOR EXAM: Low back pain
TECHNIQUE: Contiguous 2.5 mm thick axial images were obtained through the abdomen and pelvis after IV contrast administration. Informed consent was obtained prior to administration of IV contrast. A prior from 07/04/2019 is available for comparison.

[Series 3: abd/pel · axial · 0.69mm/px · z∈[-626,-228]mm · 14 of 177 slices shown, 16 images]
[im 9/177  soft-tissue]
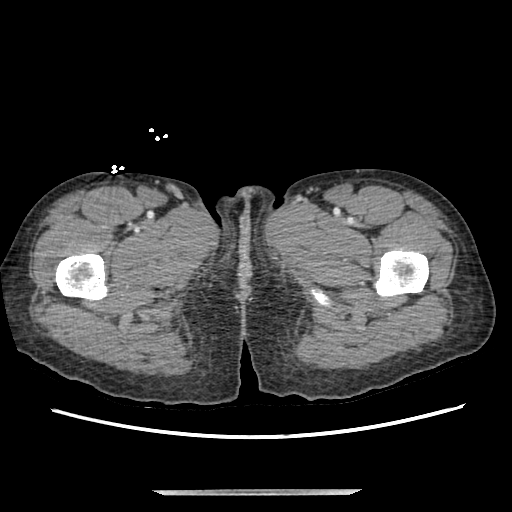
[im 9/177  bone]
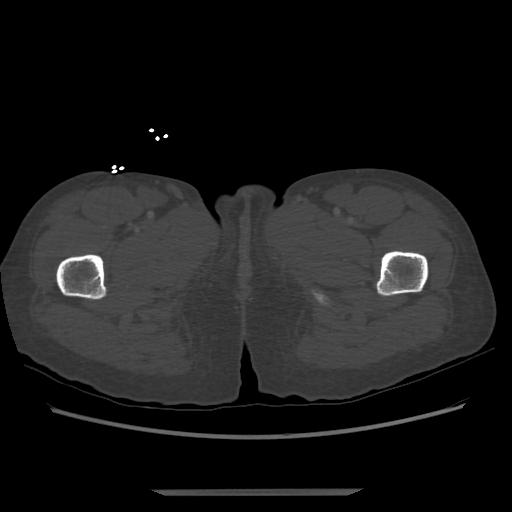
[im 26/177  soft-tissue]
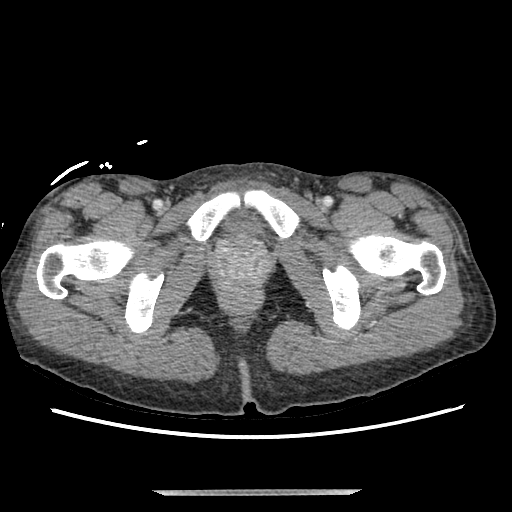
[im 34/177  soft-tissue]
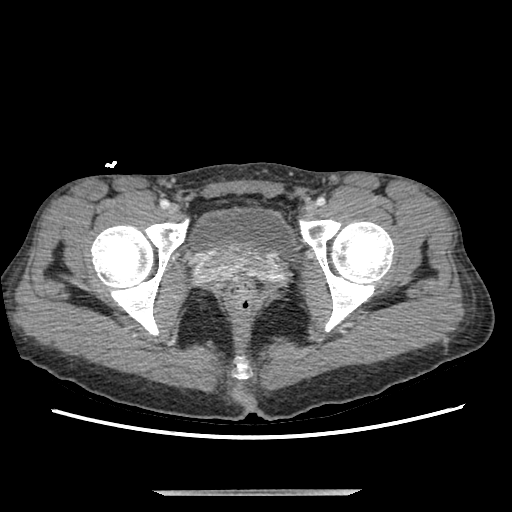
[im 51/177  soft-tissue]
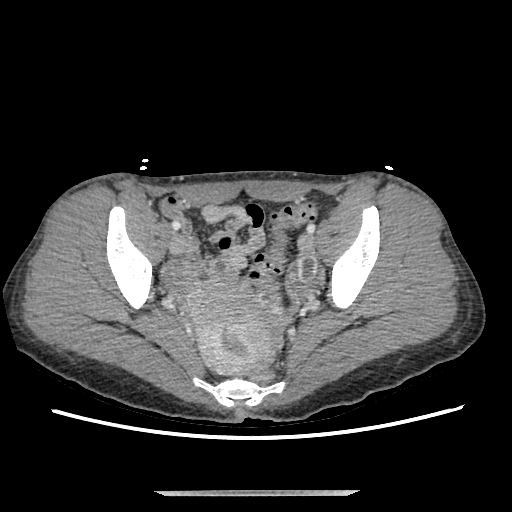
[im 59/177  soft-tissue]
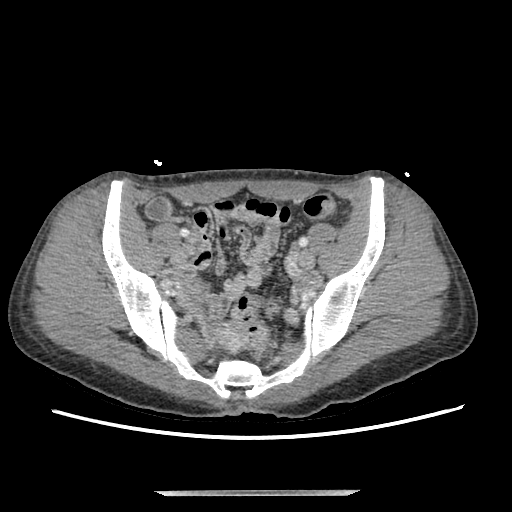
[im 68/177  soft-tissue]
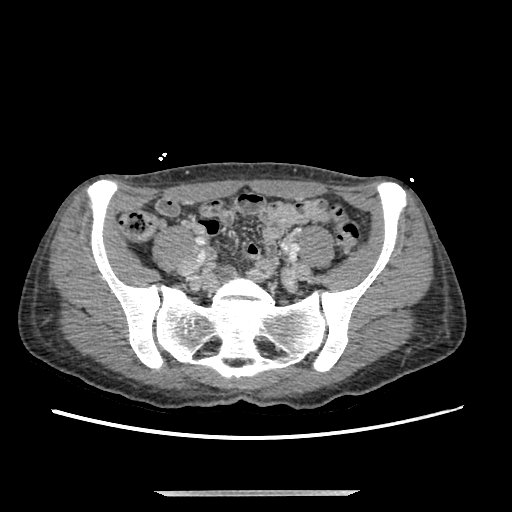
[im 84/177  soft-tissue]
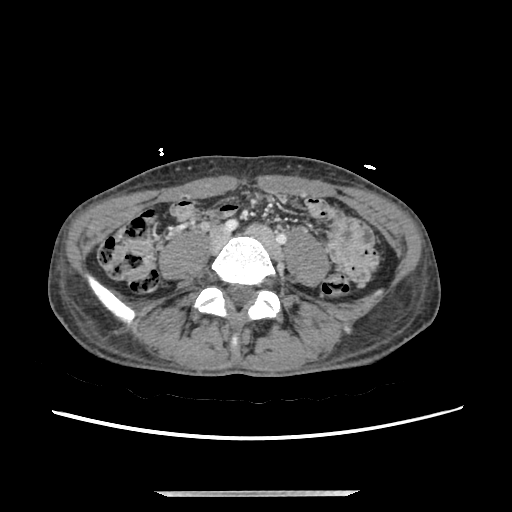
[im 93/177  soft-tissue]
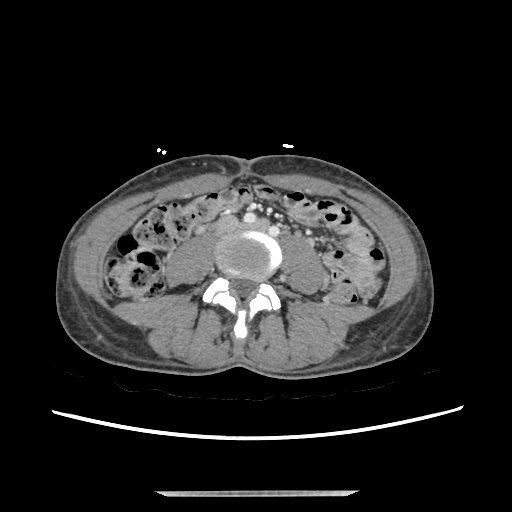
[im 109/177  soft-tissue]
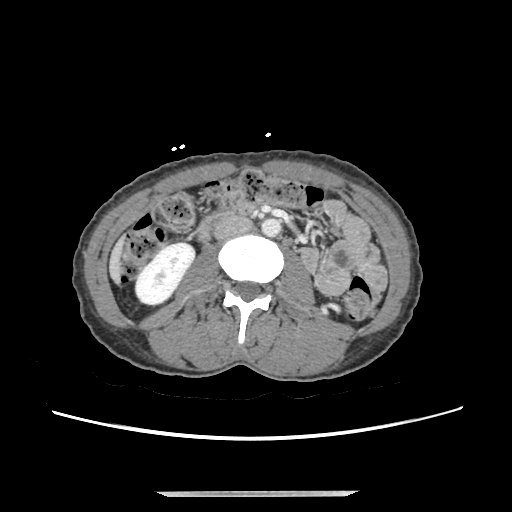
[im 109/177  bone]
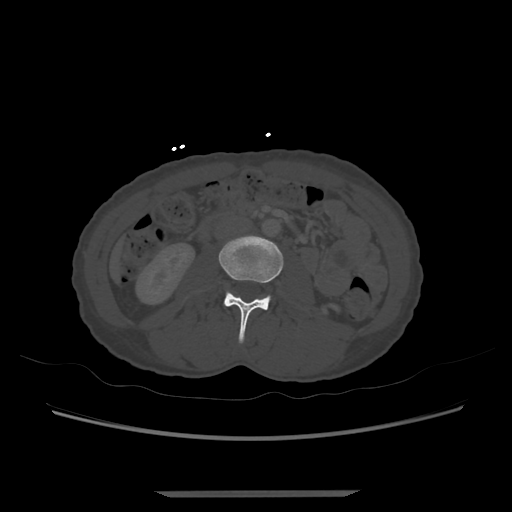
[im 118/177  soft-tissue]
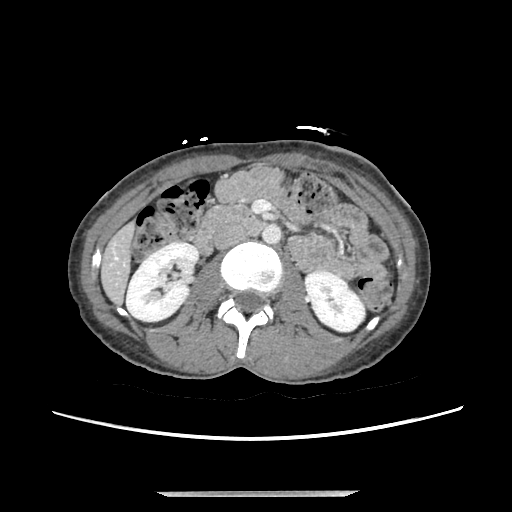
[im 135/177  soft-tissue]
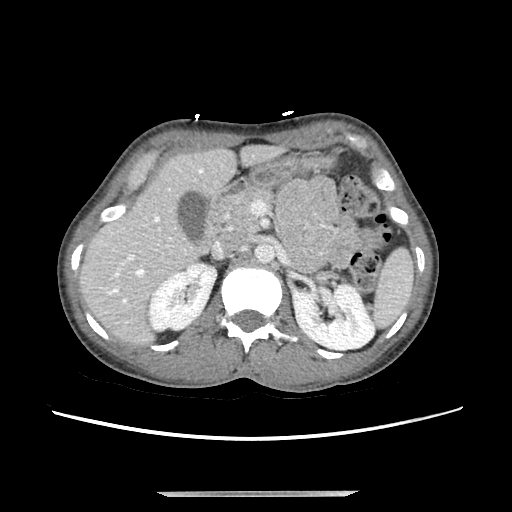
[im 143/177  soft-tissue]
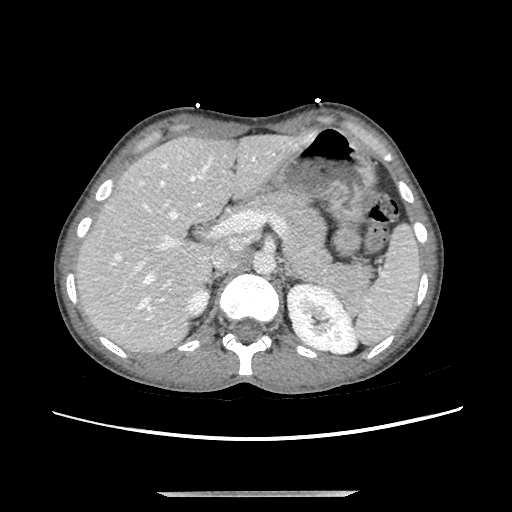
[im 151/177  soft-tissue]
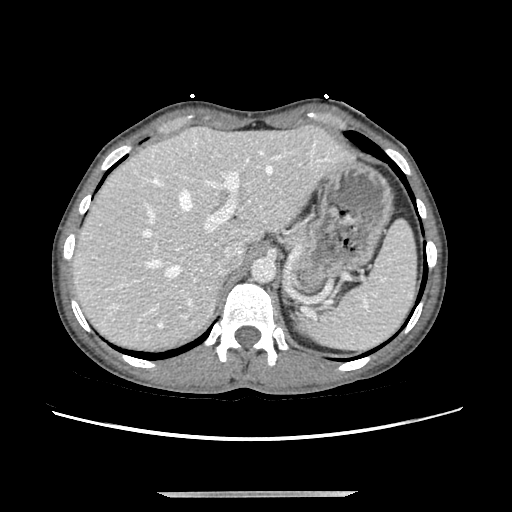
[im 168/177  soft-tissue]
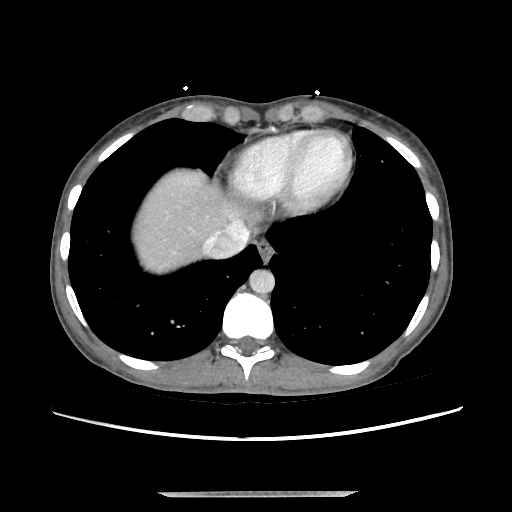

[Series 607: sag standard 2x2 · sagittal · 0.86mm/px · 3 of 173 slices shown]
[im 58/173  soft-tissue]
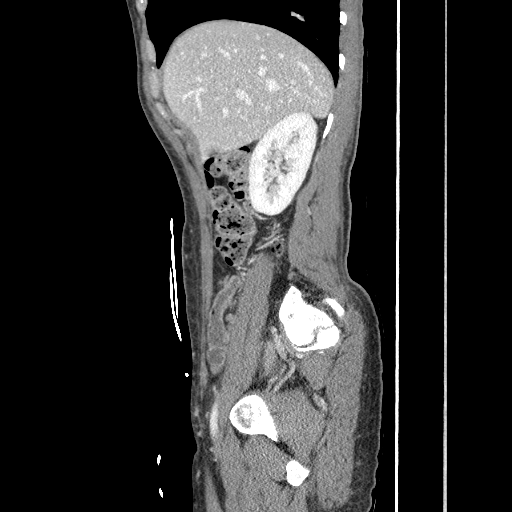
[im 77/173  soft-tissue]
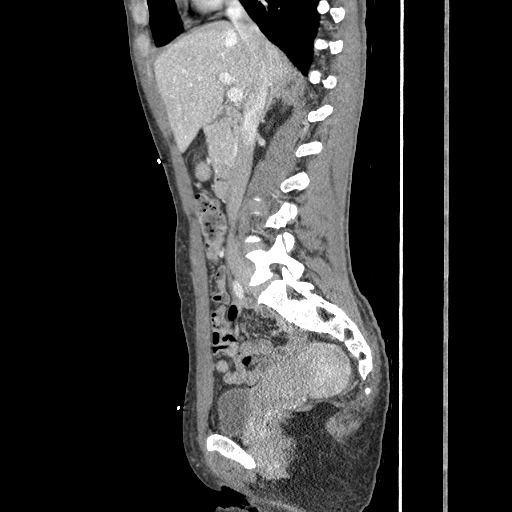
[im 96/173  soft-tissue]
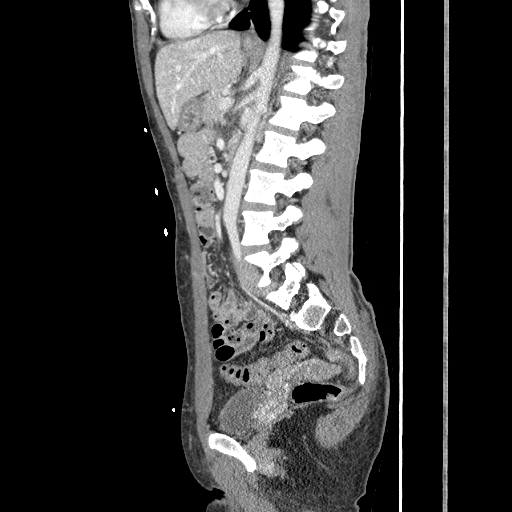

[17 of 46 positions shown; findings below may reference images not displayed]

FINDINGS: The liver, spleen, pancreas, adrenal glands, and kidneys are normal. The abdominal aorta is of normal caliber. A normal-appearing aortic bifurcation is seen.
The gallbladder is grossly normal. The appendix is within normal limits. There is no abdominal or pelvic lymphadenopathy or ascites. There is a 2.1 cm involuting left ovarian follicle. The urinary bladder is normal. The osseous structures are normal.
IMPRESSION: Involuting left ovarian follicle. Otherwise unremarkable exam.
Location:1

## 2020-06-02 IMAGING — US US LOW EXT VEINS RIGHT
1 series · 14 of 16 positions shown · non-contrast
Comparison: none

Exam: RIGHT LOWER EXTREMITY VENOUS DOPPLER EXAM:
CLINICAL INDICATION: pain, hx dvt
TECHNIQUE: Grayscale and color Doppler imaging was utilized to evaluate the deep venous structures of the right lower extremity

[Series 1: us low ext veins right · 14 of 29 slices shown]
[im 1/29]
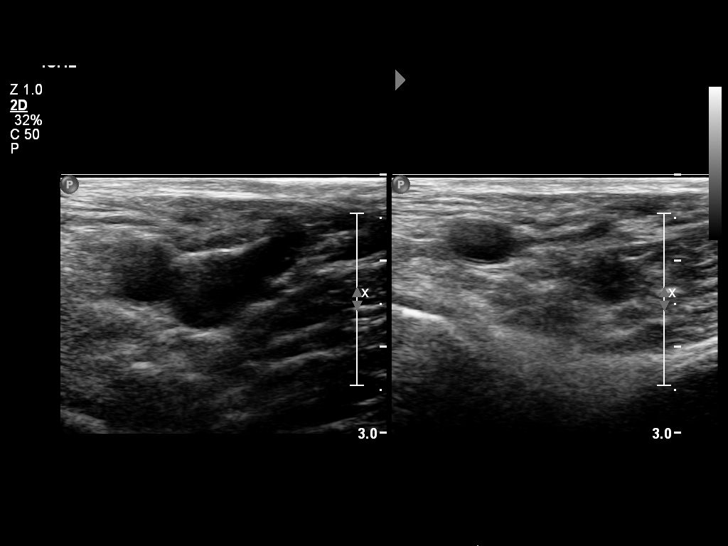
[im 2/29]
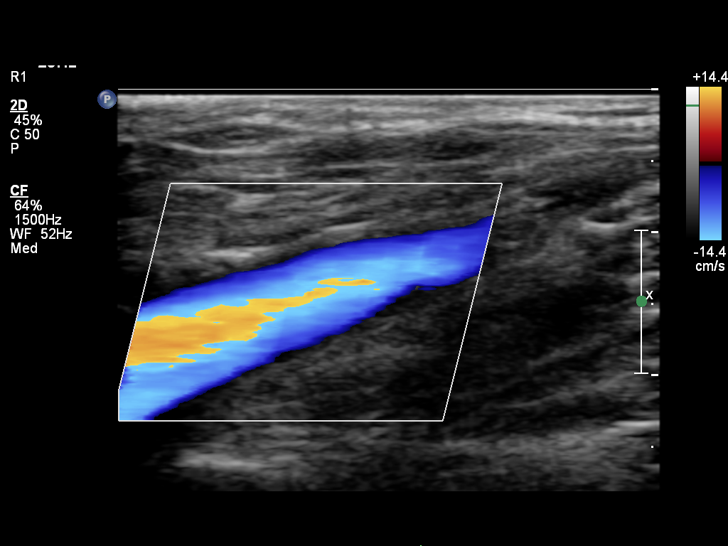
[im 4/29]
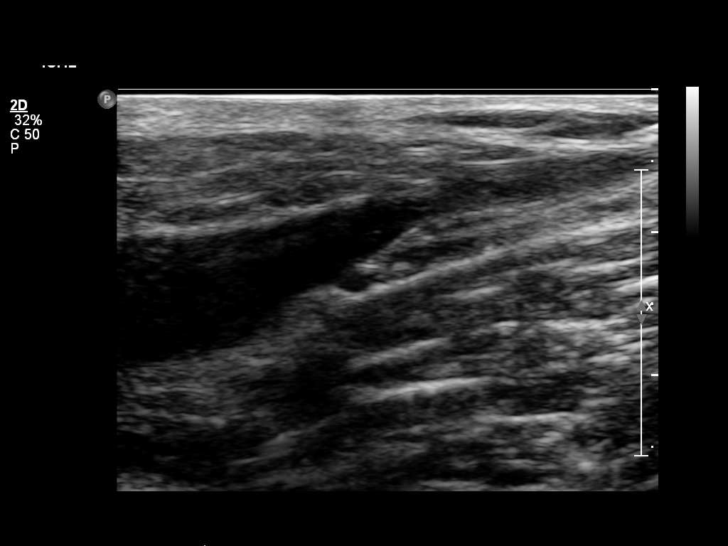
[im 8/29]
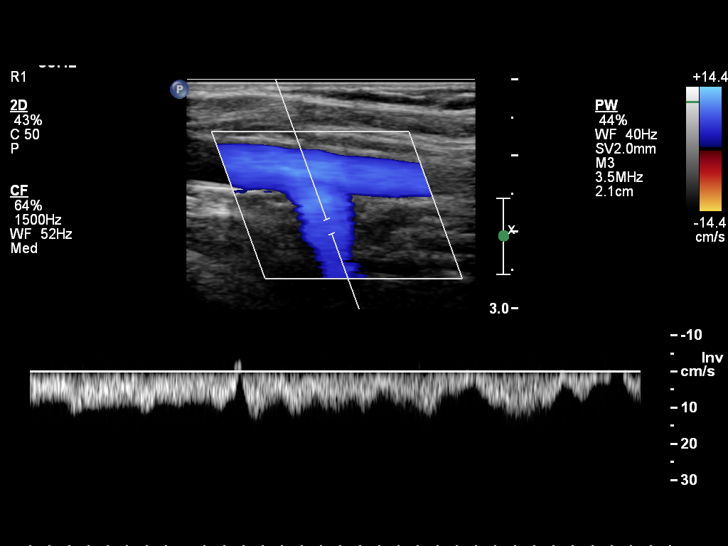
[im 10/29]
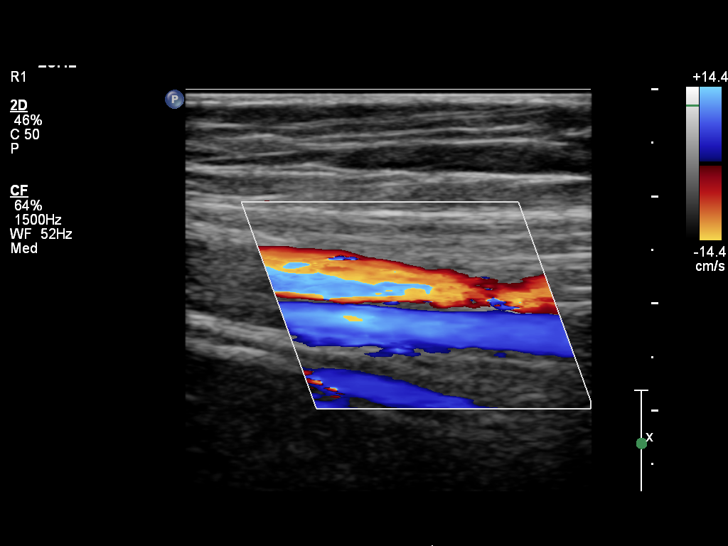
[im 12/29]
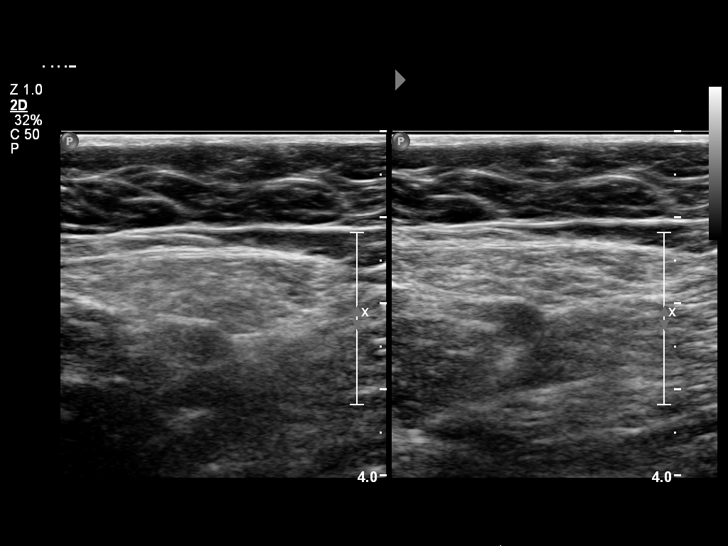
[im 14/29]
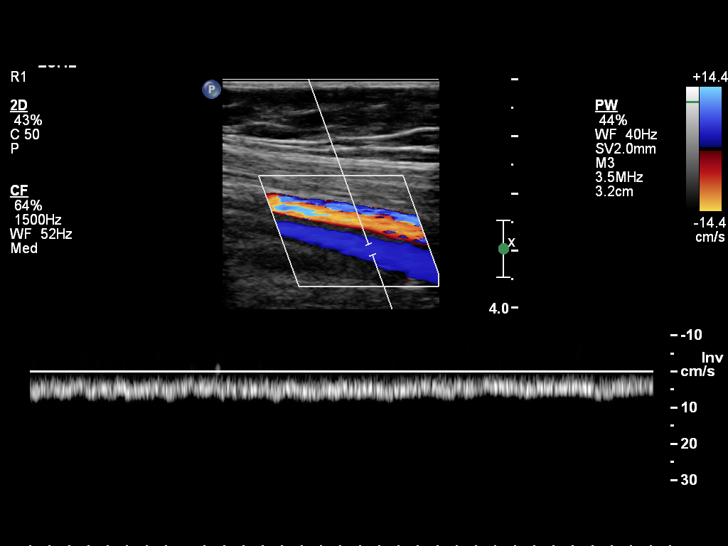
[im 15/29]
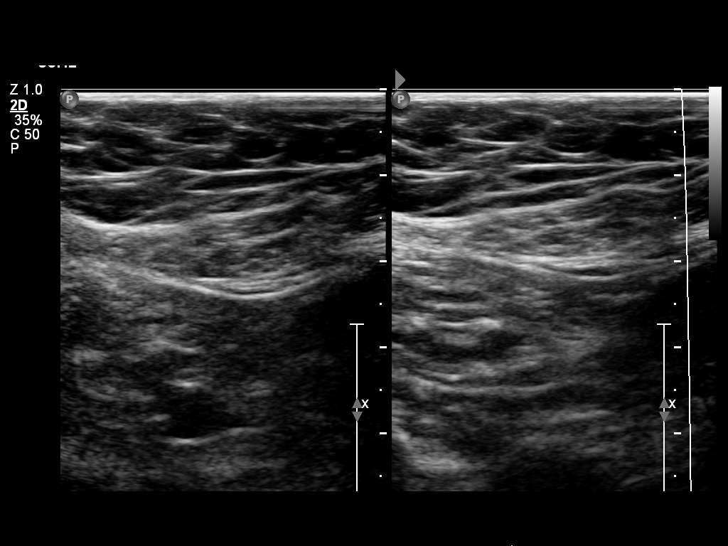
[im 17/29]
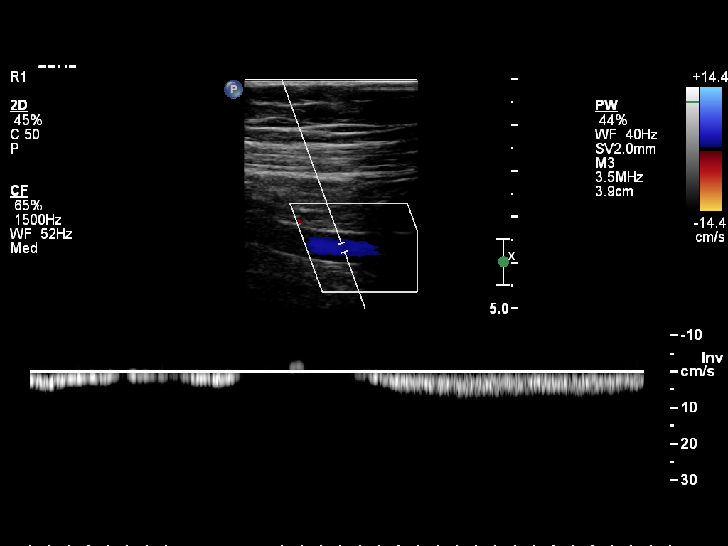
[im 19/29]
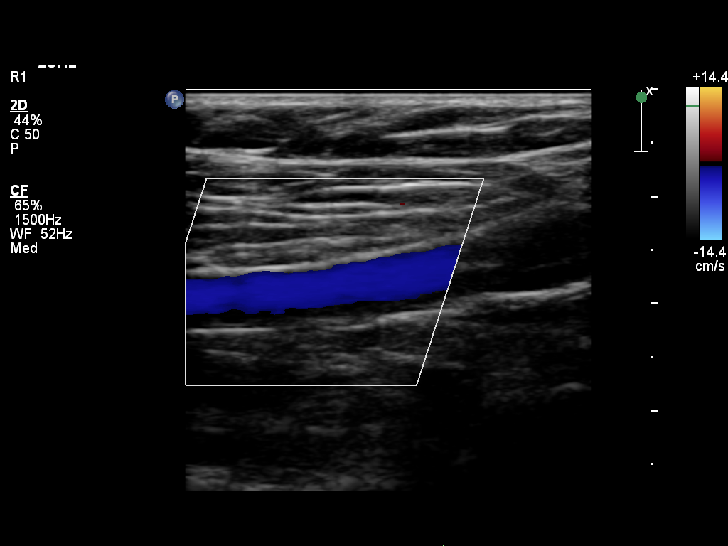
[im 23/29]
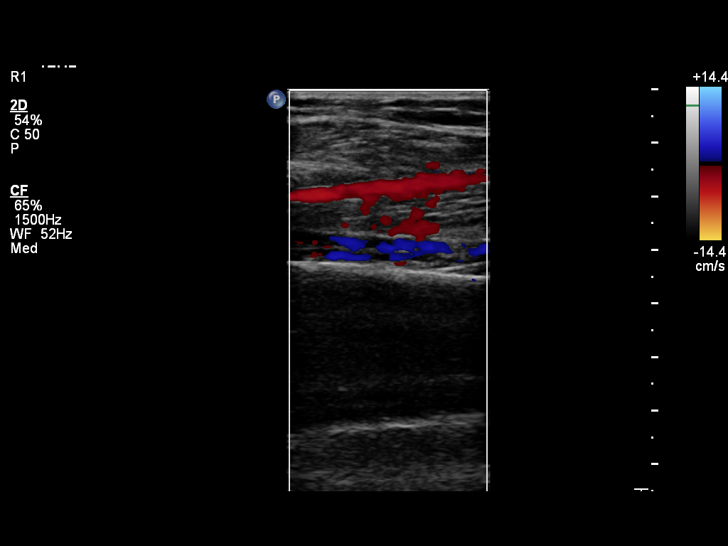
[im 25/29]
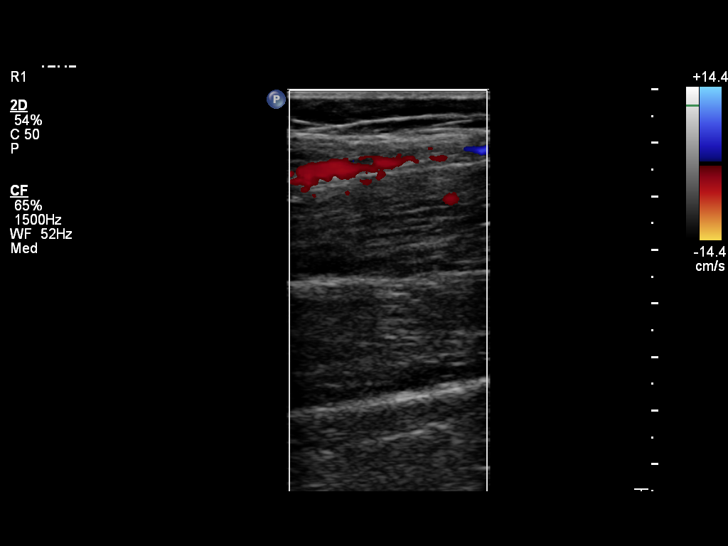
[im 27/29]
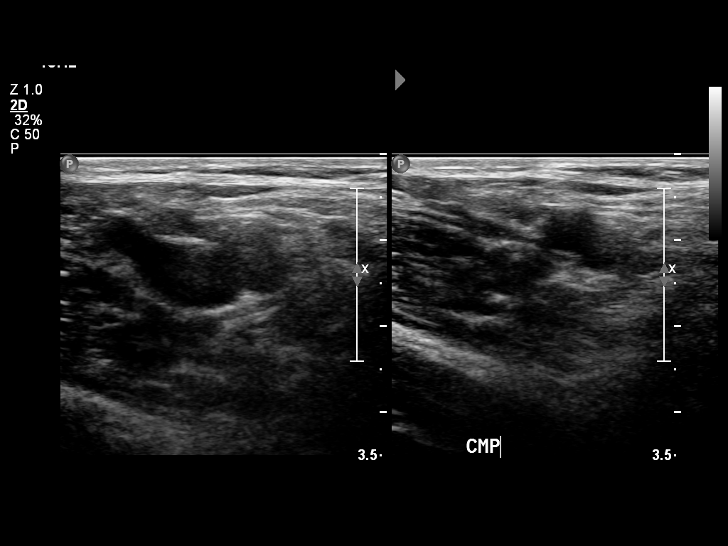
[im 29/29]
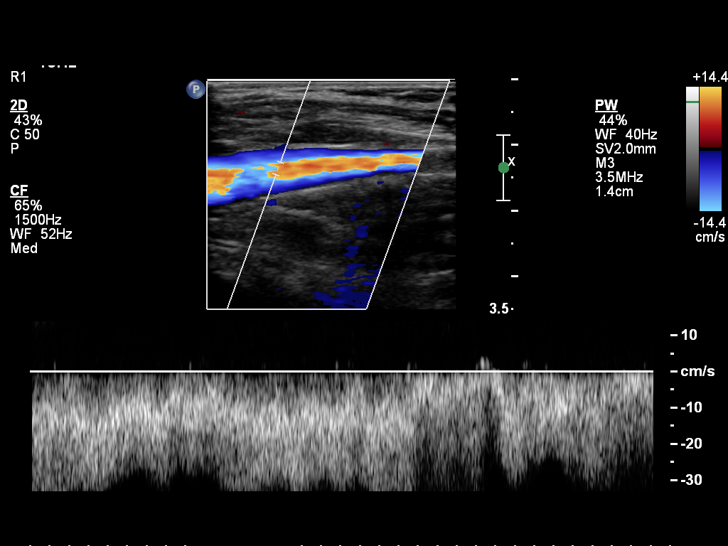

[14 of 16 positions shown; findings below may reference images not displayed]

FINDINGS: All deep venous structures demonstrate normal compressibility, waveforms, augmentation, and flow.
IMPRESSION: No deep venous thrombosis identified.
LOCATION CODE : 10
Is the patient pregnant?
Unknown

## 2020-06-02 IMAGING — DX XR CHEST 1 VIEW
1 series · 1 of 1 positions shown · non-contrast
Comparison: none

Shortness of breath
SINGLE PORTABLE CHEST:
CLINICAL INDICATION: soa
REFERENCE: 05/07/2020

[AP]
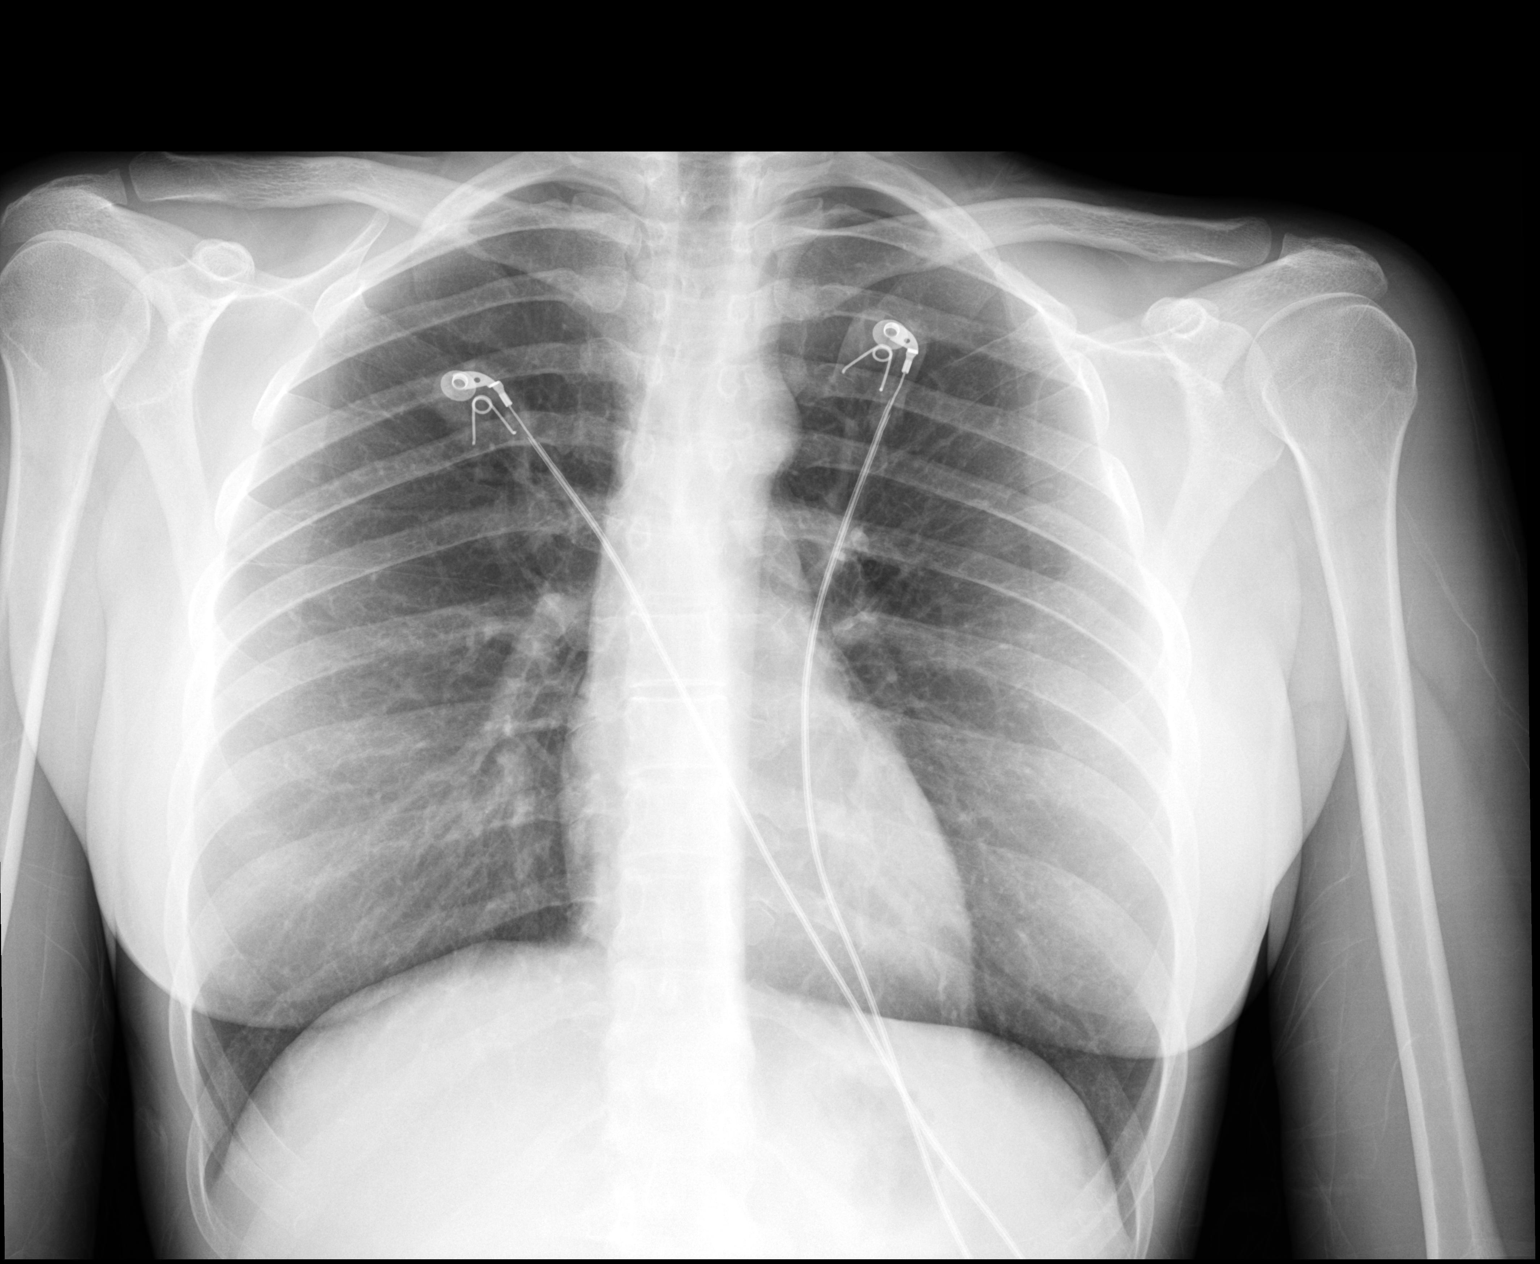

[1 of 1 positions shown; findings below may reference images not displayed]

FINDINGS: Single radiograph of the chest demonstrates normal cardiomediastinal contours.
The lungs demonstrate no mass, infiltrate, or effusion.
Surrounding osseous and soft tissue structures are unremarkable.
IMPRESSION: No acute cardiopulmonary process identified.
LOCATION CODE: 10
Is the patient pregnant?
Unknown

## 2020-06-07 IMAGING — CT CT ABDOMEN PELVIS W CONTRAST
2 of 4 series · 12 of 46 positions shown, 14 images · IV contrast (Iodine)
Comparison: none

CT OF THE ABDOMEN WITH CONTRAST:
Location number is 9
CLINICAL INDICATION:  Left upper quadrant pain
REFERENCE:  05/01/2020 exam.
TECHNIQUE: Informed consent was obtained for the administration of contrast.  Multiple axial images were obtained from the lung bases through the upper pelvis following the uneventful administration of oral and IV contrast.
TECHNIQUE: Informed consent was obtained for the administration of contrast.  Multiple axial images were obtained from the lower abdomen through the upper thighs following the uneventful administration of IV contrast.

[Series 201: abd pel, idose (2) · axial · 0.66mm/px · z∈[-404,-77]mm · 9 of 165 slices shown, 11 images]
[im 17/165  soft-tissue]
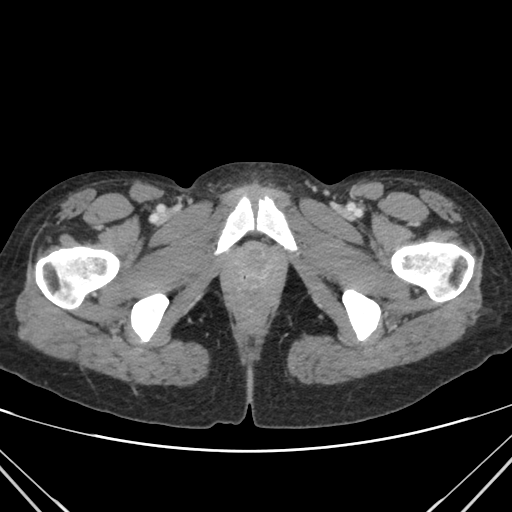
[im 17/165  bone]
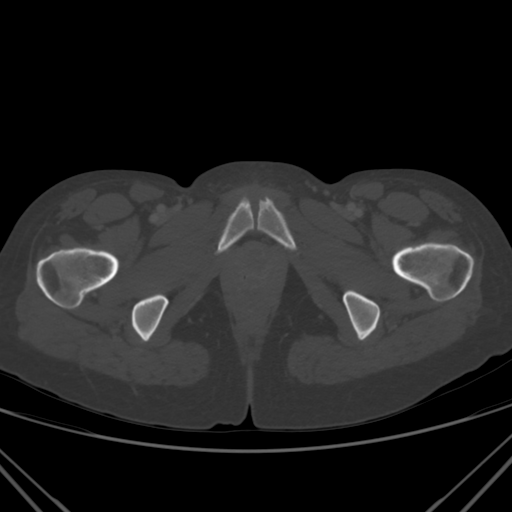
[im 33/165  soft-tissue]
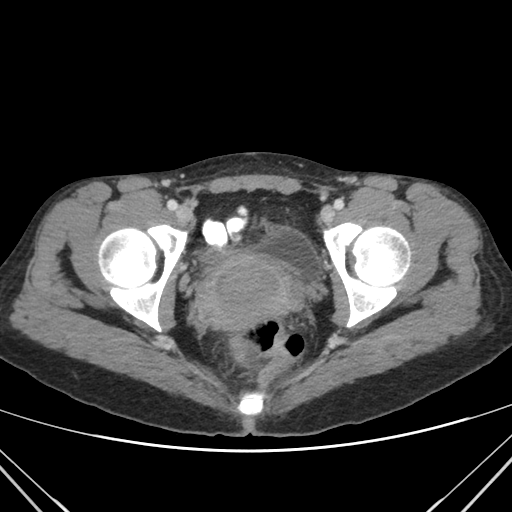
[im 50/165  soft-tissue]
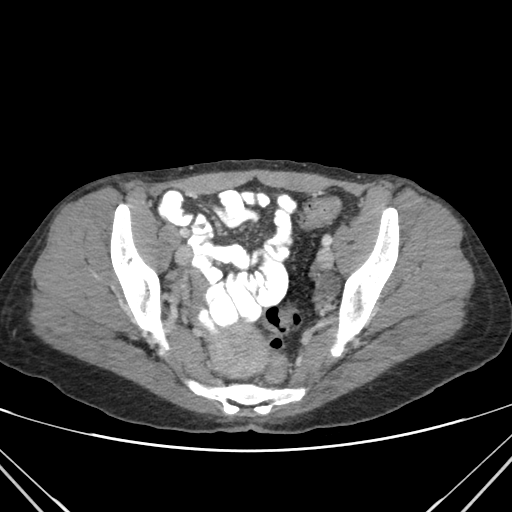
[im 66/165  soft-tissue]
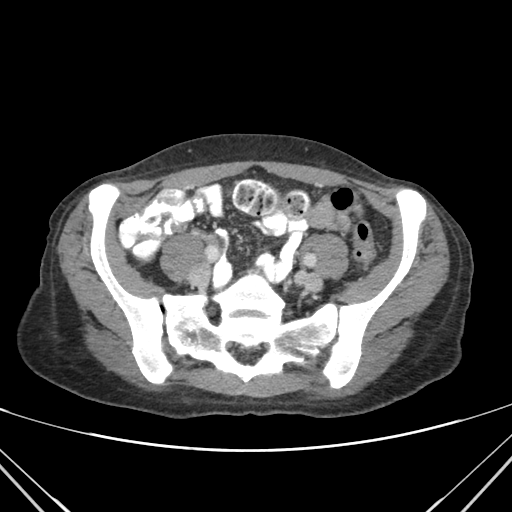
[im 83/165  soft-tissue]
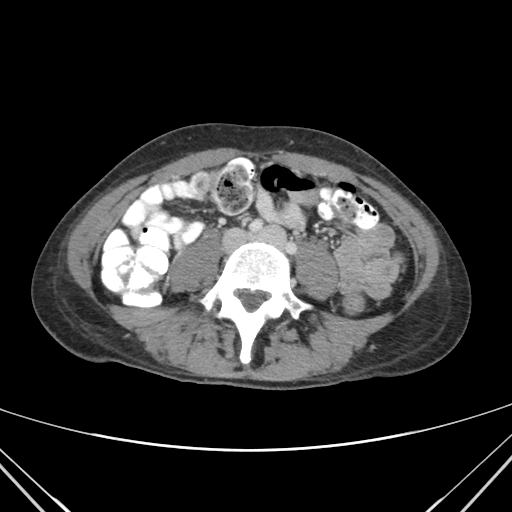
[im 99/165  soft-tissue]
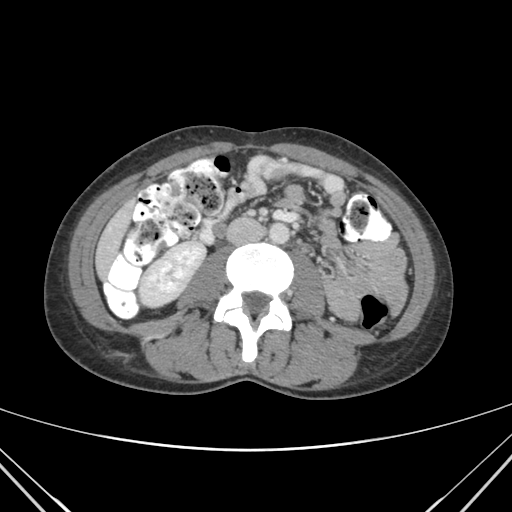
[im 115/165  soft-tissue]
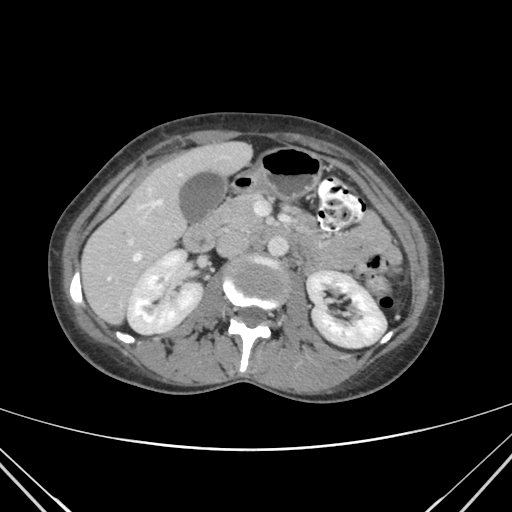
[im 132/165  soft-tissue]
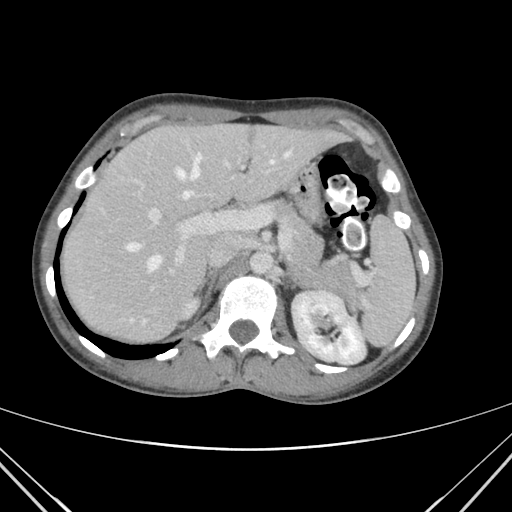
[im 148/165  soft-tissue]
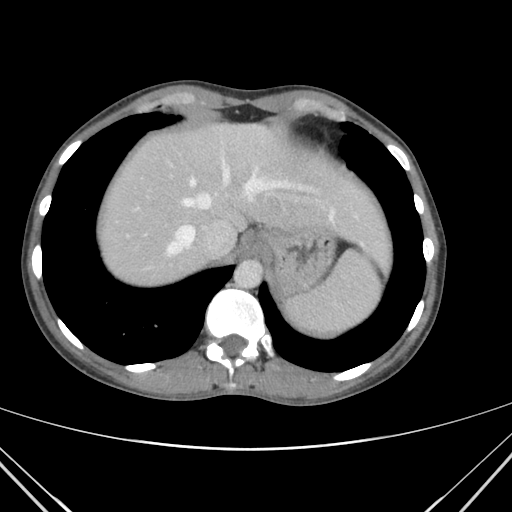
[im 148/165  bone]
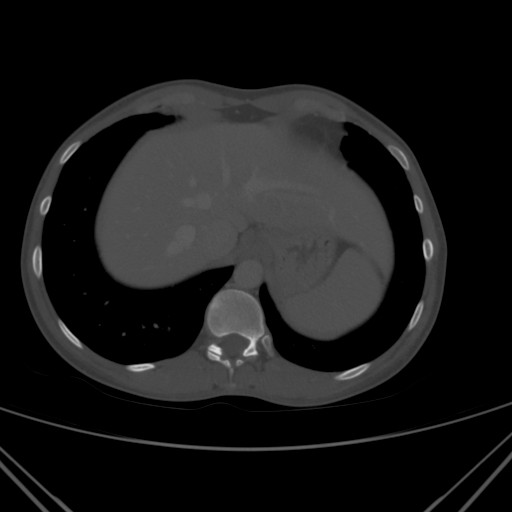

[Series 203: coronal, idose (2) · coronal · 0.45mm/px · 3 of 128 slices shown]
[im 43/128  soft-tissue]
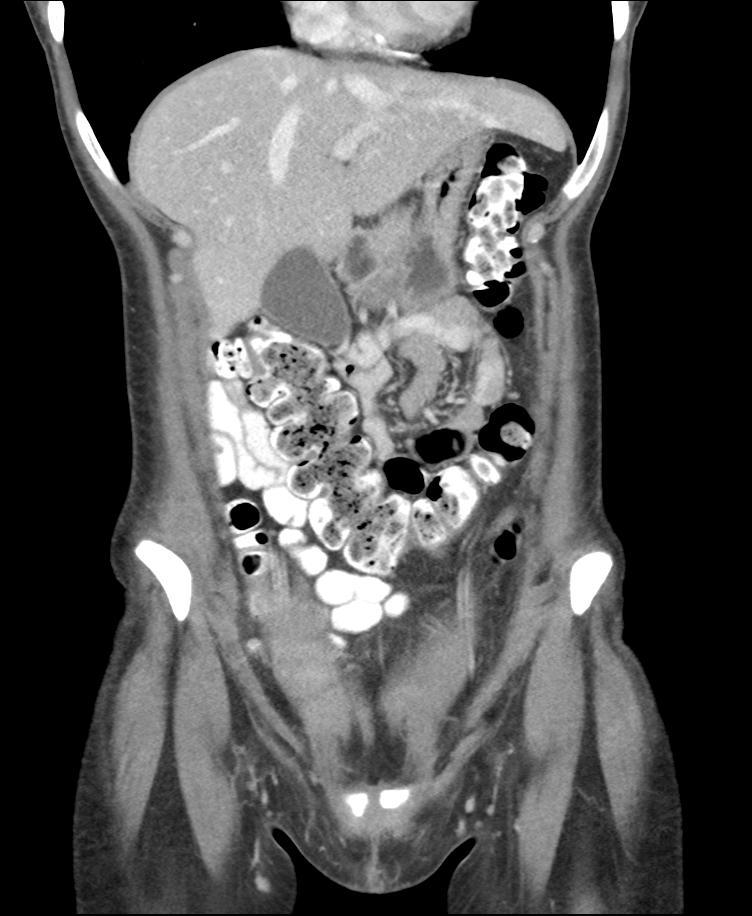
[im 57/128  soft-tissue]
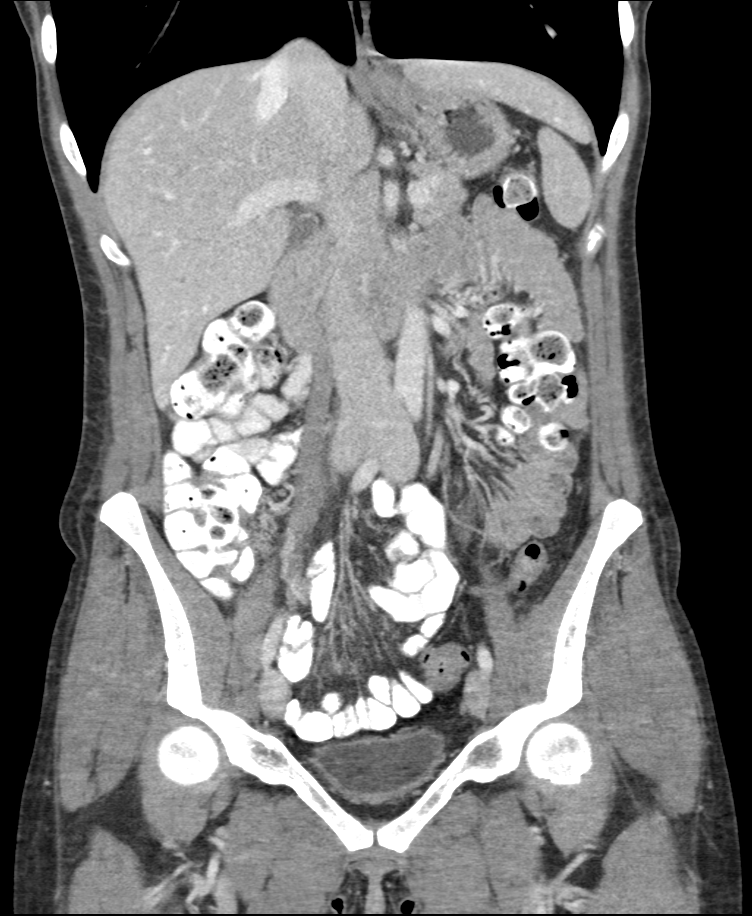
[im 71/128  soft-tissue]
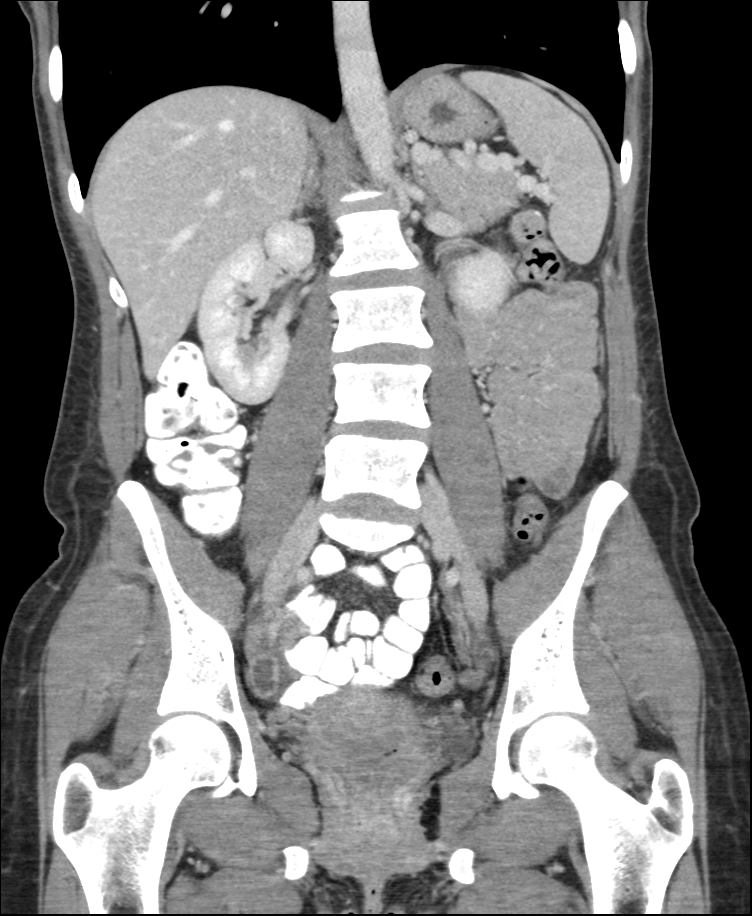

[12 of 46 positions shown; findings below may reference images not displayed]

FINDINGS: Lung bases:  Lung bases are clear.
Liver: Liver shows normal contour, attenuation and no intrahepatic biliary duct dilation.
The gallbladder: Appears unremarkable.
Stomach is unremarkable.
Spleen: Spleen shows normal contour and attenuation.
Pancreas: Pancreas appears unremarkable.
Kidneys: Demonstrate normal size, contour, attenuation, and no hydronephrosis.
The bowel: The bowel demonstrate normal caliber .  Appendix is normal in caliber.
Lymph nodes: There is no lymphadenopathy.
Abdominal wall:  Appears unremarkable.
Vessels:  Appear unremarkable.
Free fluid/free air:  There is no free air or free fluid.
Bones: Appear unremarkable.
CT OF THE PELVIS WITH CONTRAST:
FINDINGS: Pelvic portions of large and small bowel: Appears unremarkable.
Bladder: Appears unremarkable.
Vessels:  Appear unremarkable.
Free fluid: There is small volume of simple pelvic free fluid.
There is rim-enhancing cyst involving right ovary, possibly corpus luteal cyst measuring 18 mm.
IMPRESSION: No acute intra-abdominal abnormality.
Is the patient pregnant?
No

## 2020-07-02 IMAGING — US US UPPER EXTREMITY VEINS RIGHT
2 series · 14 of 25 positions shown · non-contrast
Comparison: none

RIGHT UPPER EXTREMITY VENOUS DOPPLER
INDICATION: nontraumatic arm pain, subjective swelling. Hx DVT
PROCEDURE:
Routine sonographic evaluation of the deep vein structures of the right upper extremity was performed utilizing grayscale and duplex Doppler ultrasound.

[Series 1: us upper extremity veins right · 12 of 38 slices shown (1 of 2)]
[im 1/38]
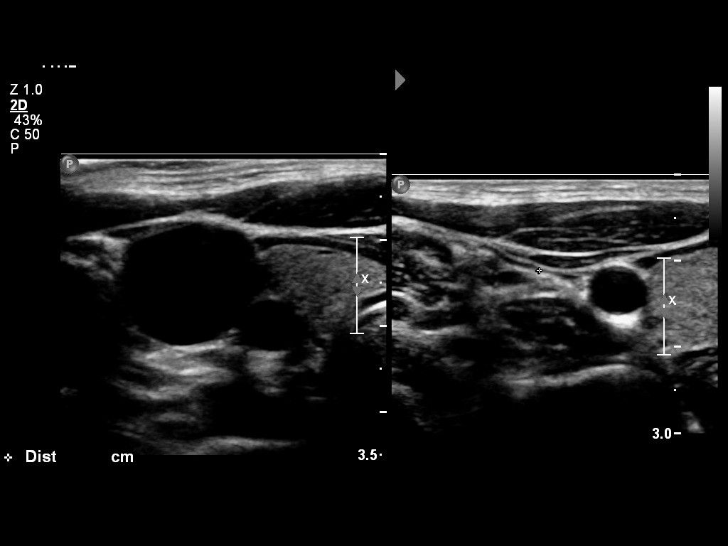
[im 4/38]
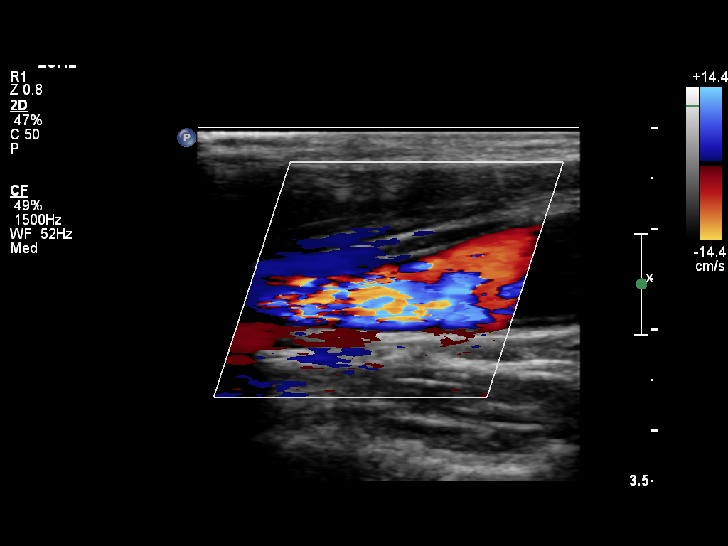
[im 8/38]
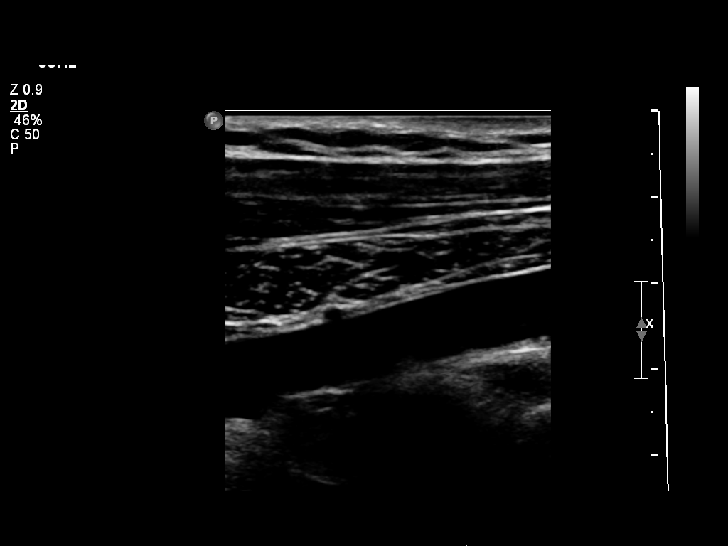
[im 11/38]
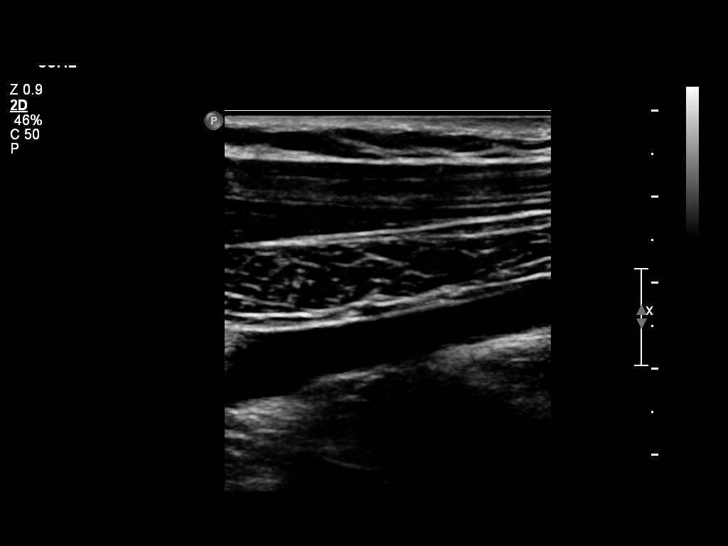
[im 15/38]
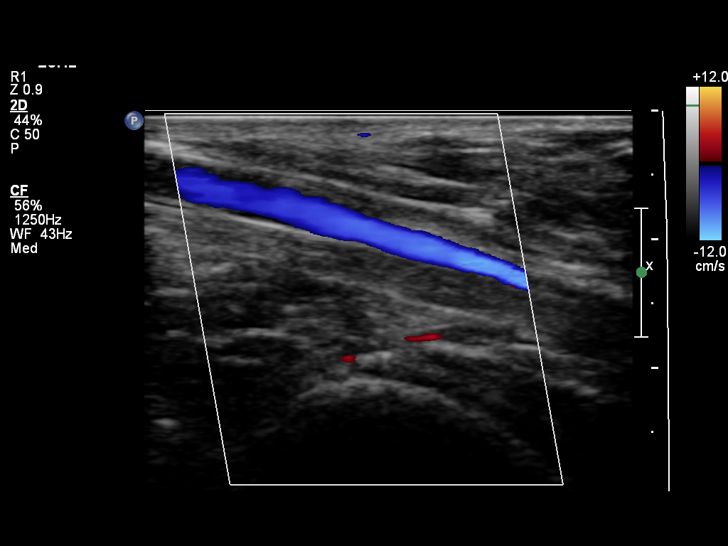
[im 16/38]
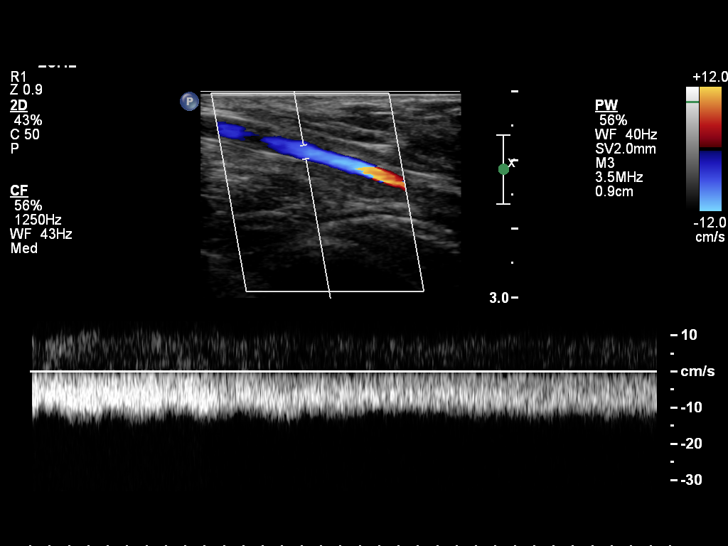
[im 20/38]
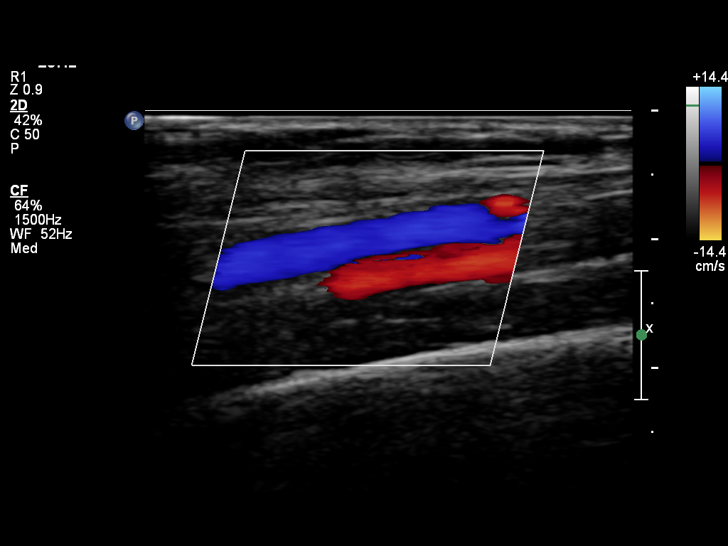
[im 23/38]
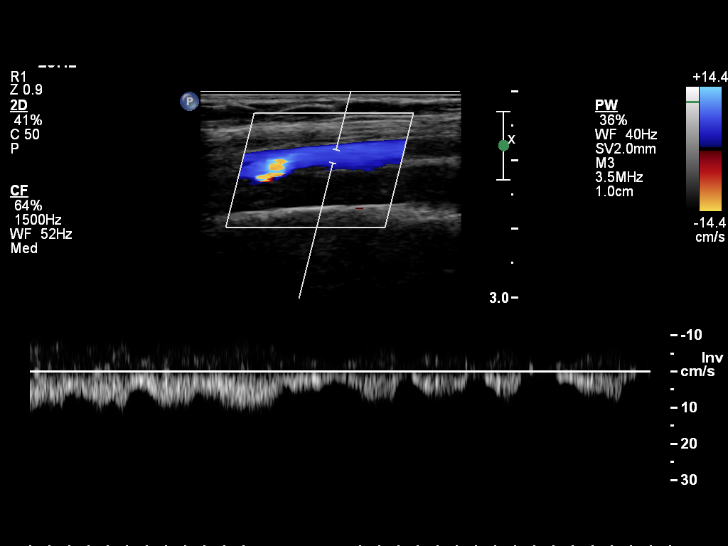
[im 27/38]
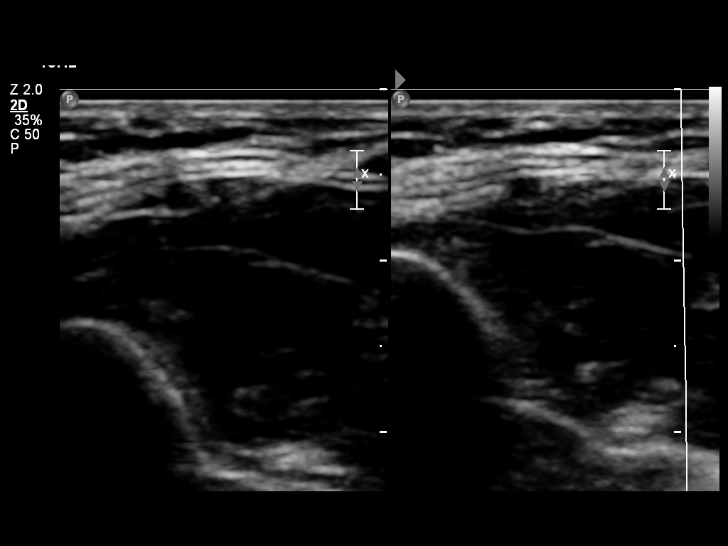
[im 29/38]
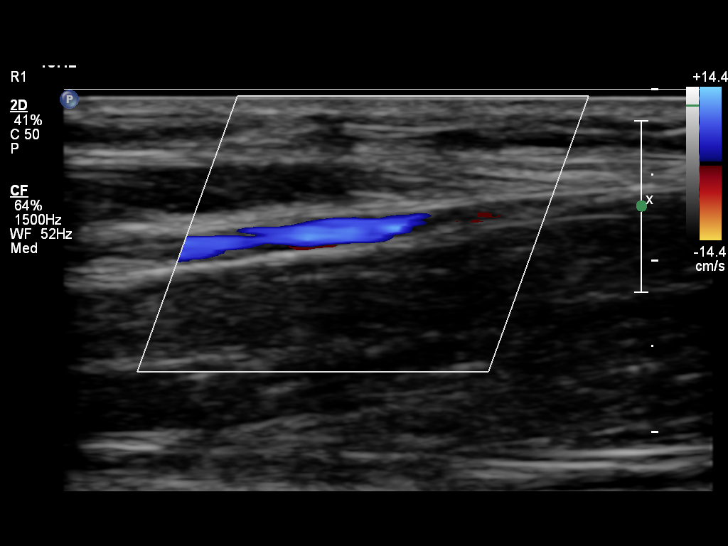
[im 32/38]
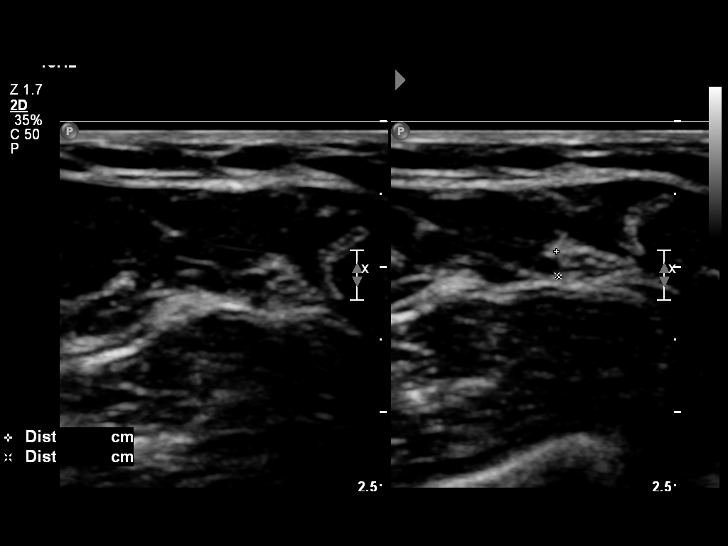
[im 36/38]
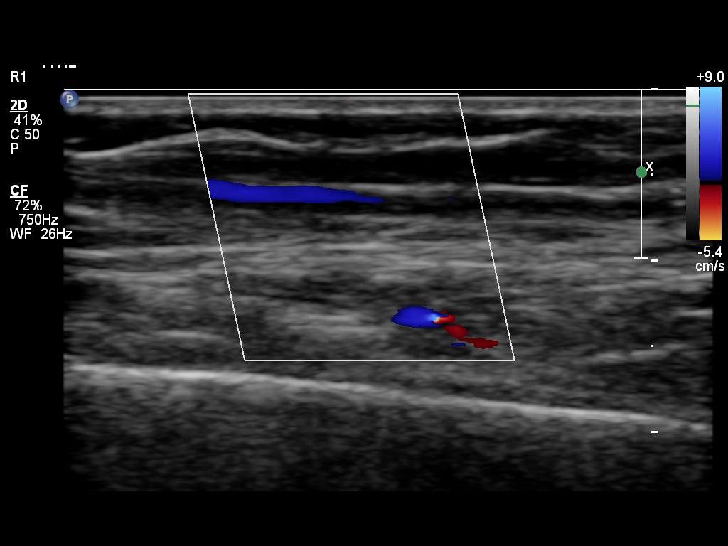

[Series 1: us upper extremity veins right · 2 of 5 slices shown (2 of 2)]
[im 1/5]
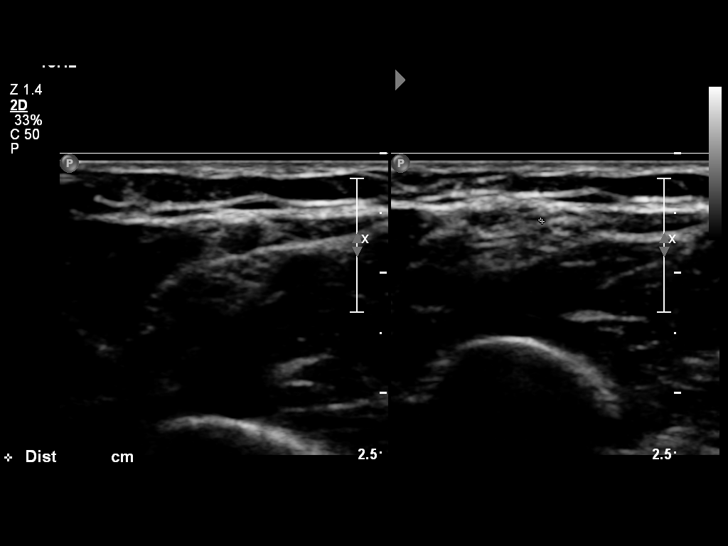
[im 5/5]
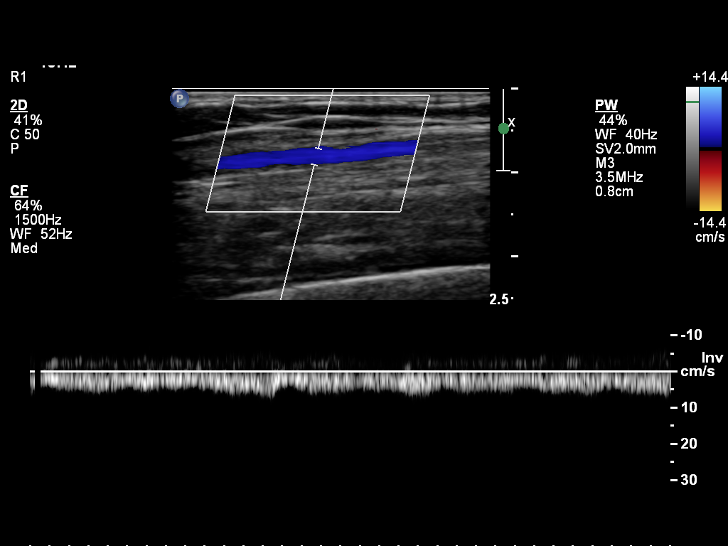

[14 of 25 positions shown; findings below may reference images not displayed]

FINDINGS: Normal compressibility, color Doppler flow response to augmentation involving the right internal jugular, subclavian, axillary, and brachial veins. The visualized radial, ulnar, cephalic and basilic veins also demonstrate normal color Doppler flow.
IMPRESSION: No right upper extremity deep venous thrombus, where examined.
Location code: 1

## 2020-08-01 IMAGING — US US LOW EXT NONVAS RIGHT
1 series · 7 of 7 positions shown · non-contrast
Comparison: None

EXAMINATION: ULTRASOUND RIGHT LOWER EXTREMITY NONVASCULAR
CLINICAL HISTORY: Palpable lesion in the right thigh.
TECHNIQUE: Multiple longitudinal and transverse images at the region of interest within the right thigh.

[Series 1: us low ext nonvas right · 7 of 7 slices shown]
[im 1/7]
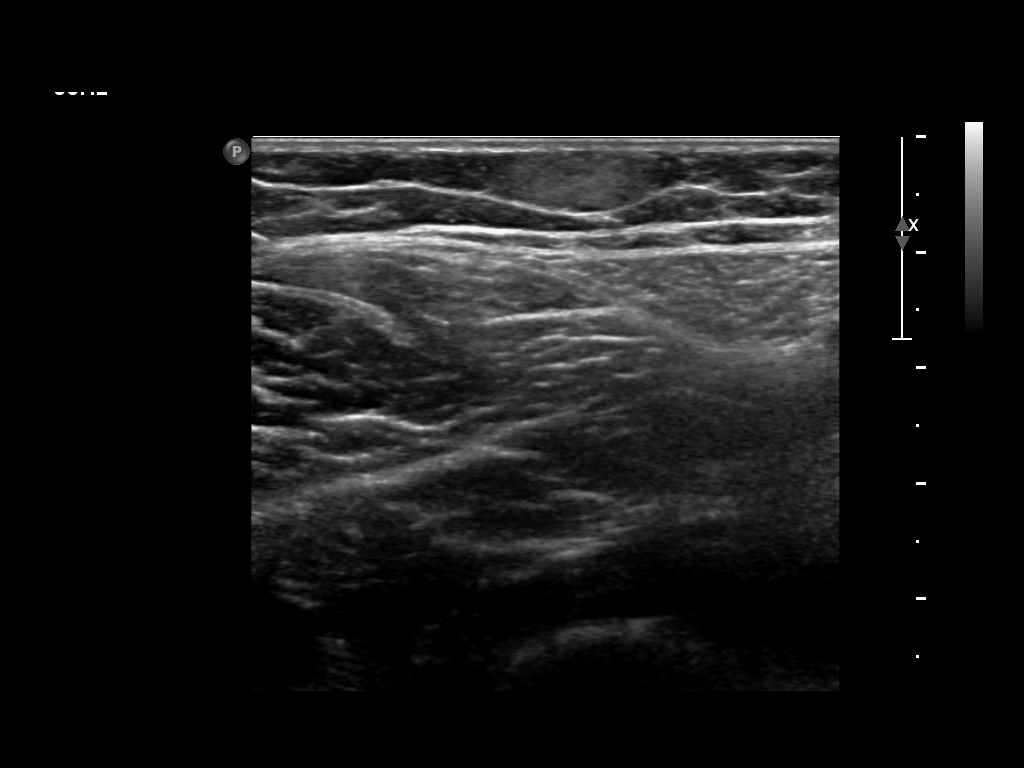
[im 2/7]
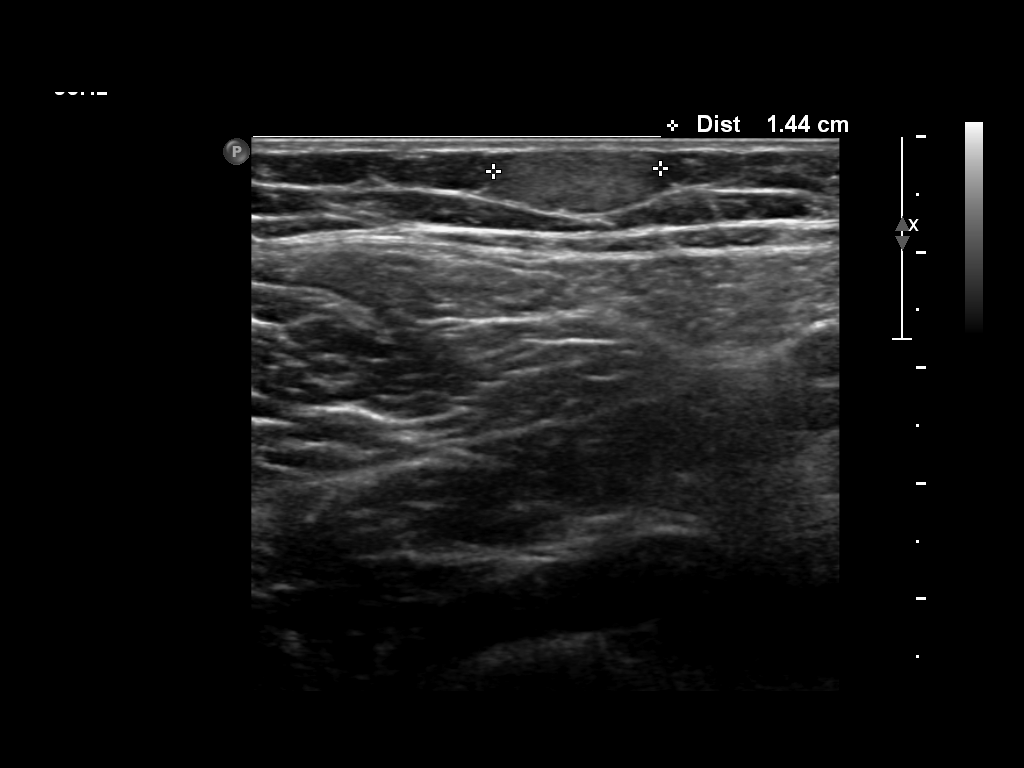
[im 3/7]
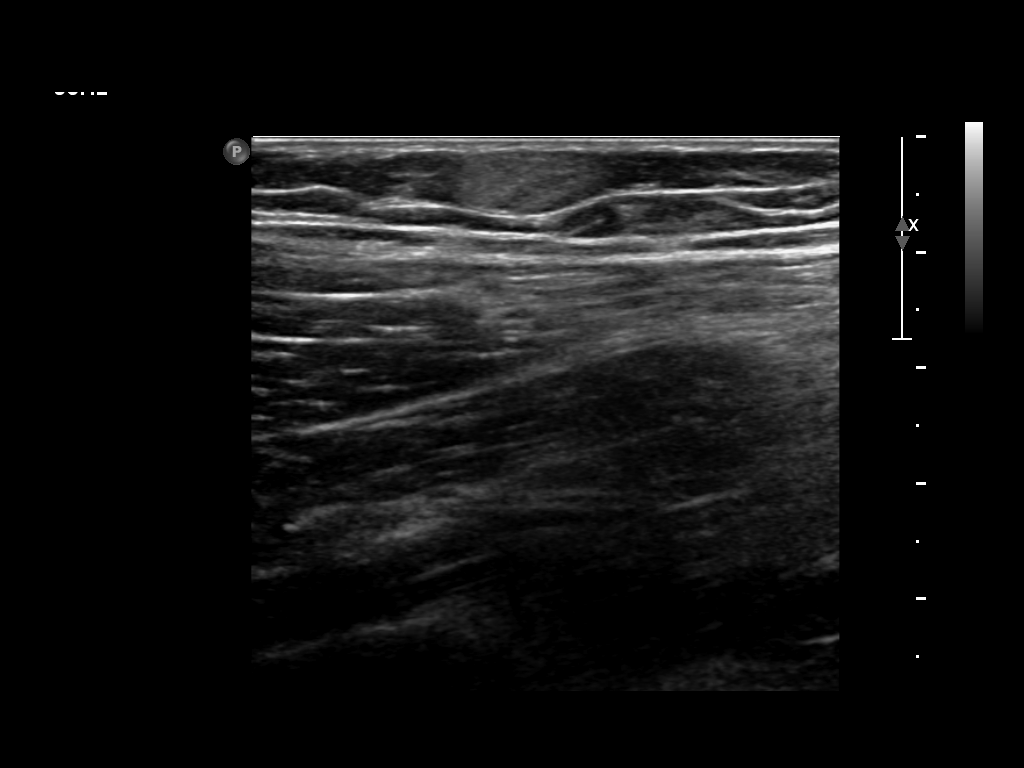
[im 4/7]
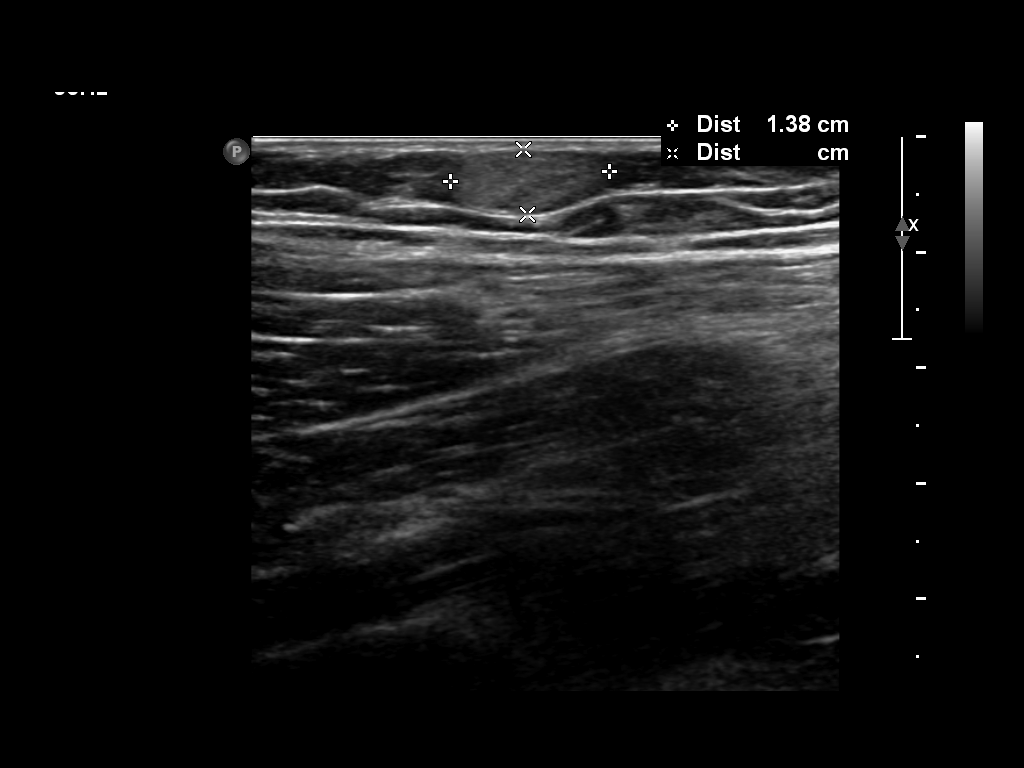
[im 5/7]
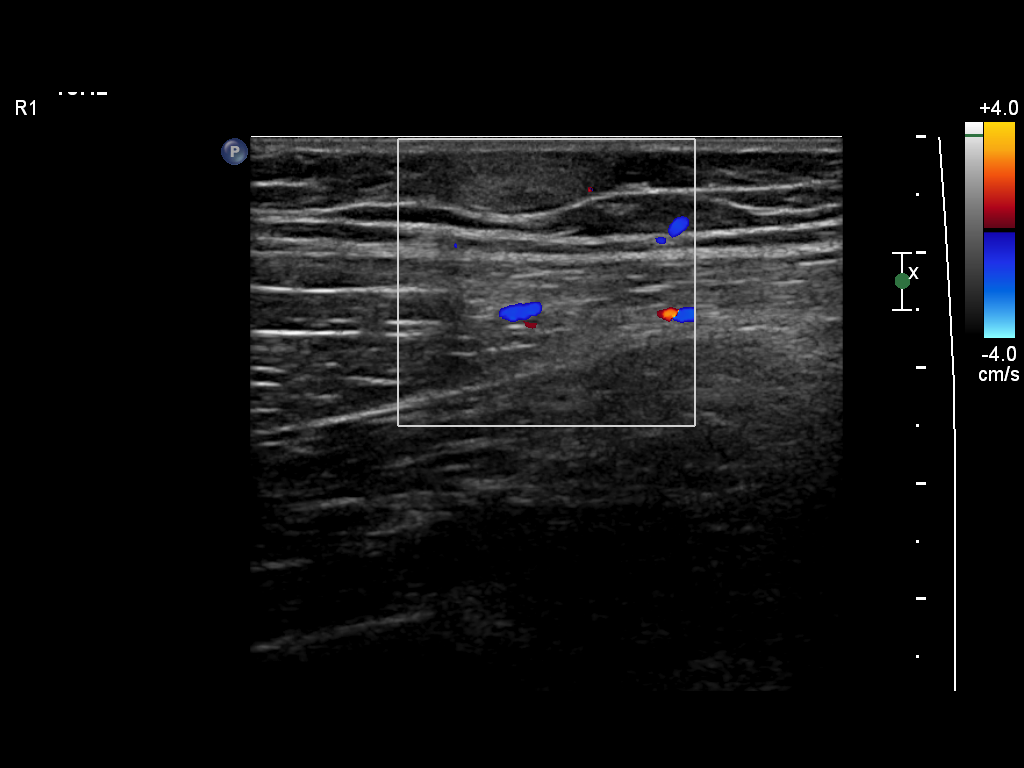
[im 6/7]
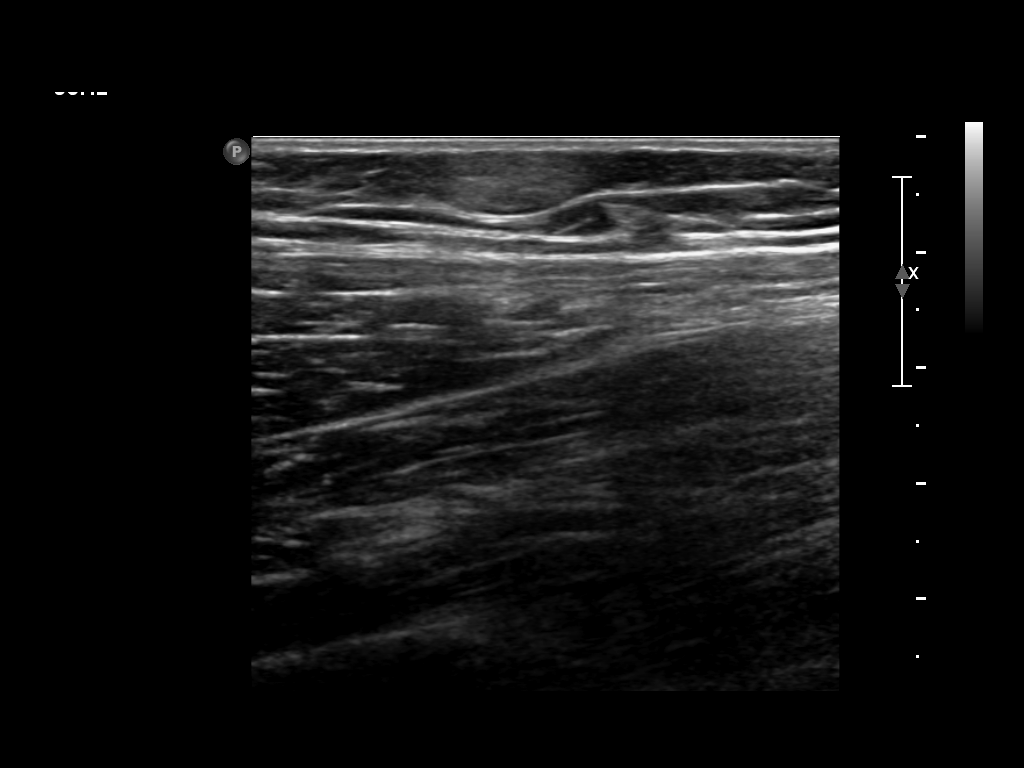
[im 7/7]
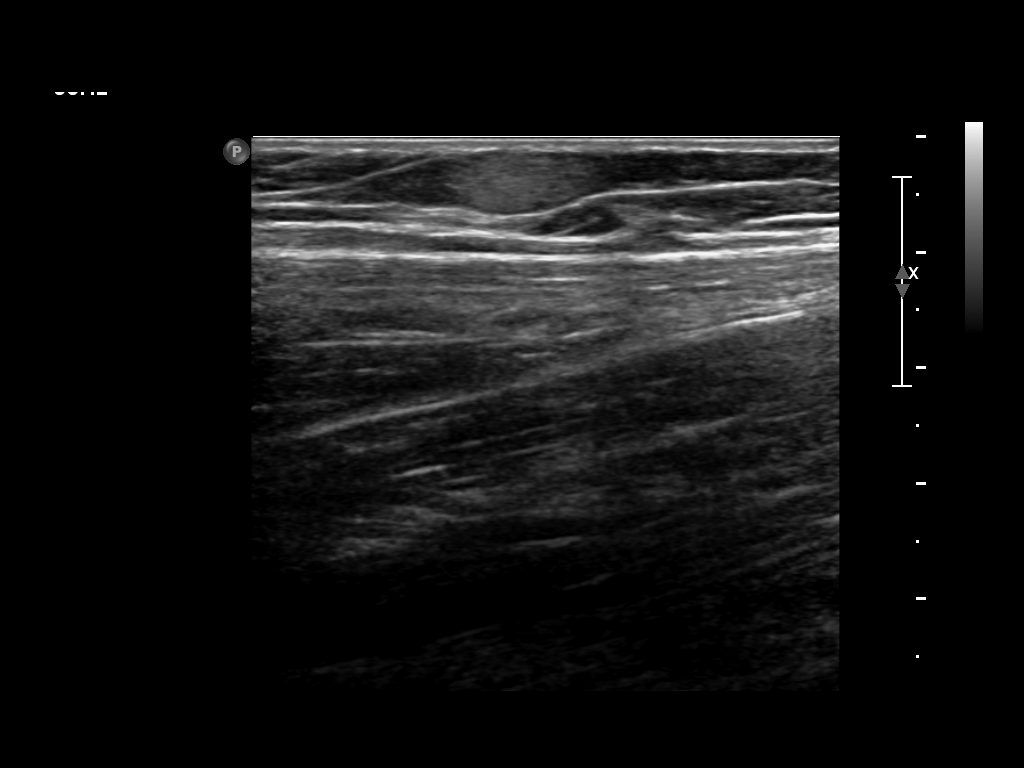

[7 of 7 positions shown; findings below may reference images not displayed]

FINDINGS: 1. 0.4 x 0.56 x 1.4 cm hyperechoic structure within the subcutaneous soft tissue. The lesion is well-circumscribed.
IMPRESSION: Indeterminate subcutaneous lesion within the right thigh.

## 2020-08-01 IMAGING — US US HEAD NECK SOFT TISSUE
1 series · 9 of 9 positions shown · non-contrast
Comparison: None

EXAMINATION: ULTRASOUND OF THE SOFT TISSUE OF THE HEAD AND NECK
HISTORY: Probable left neck soft tissue mass
TECHNIQUE: Multiple superficial soft tissue sonographic images of the left neck are submitted at the site of palpable abnormality.

[Series 1: us head neck soft tissue · 9 of 9 slices shown]
[im 1/9]
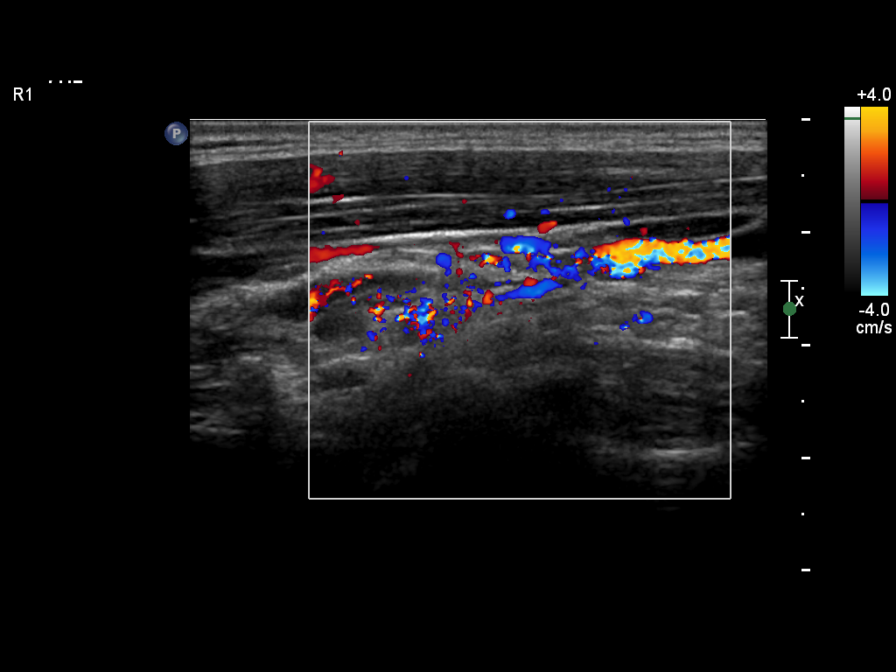
[im 2/9]
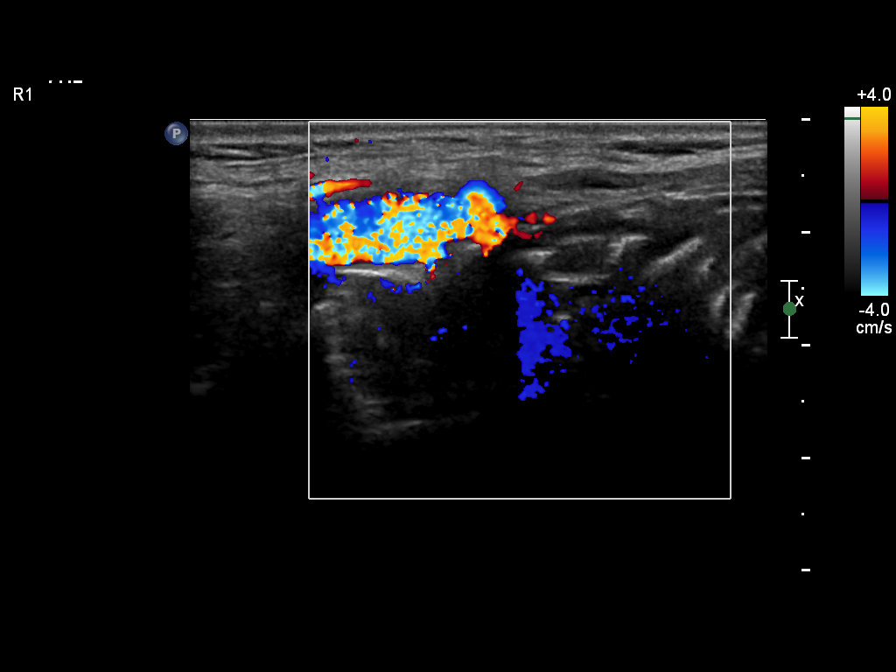
[im 3/9]
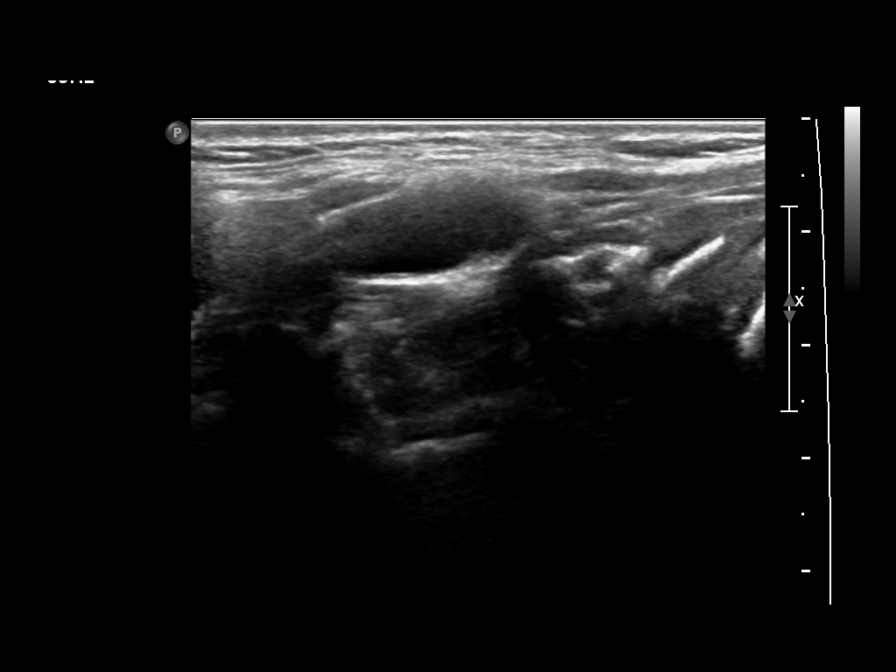
[im 4/9]
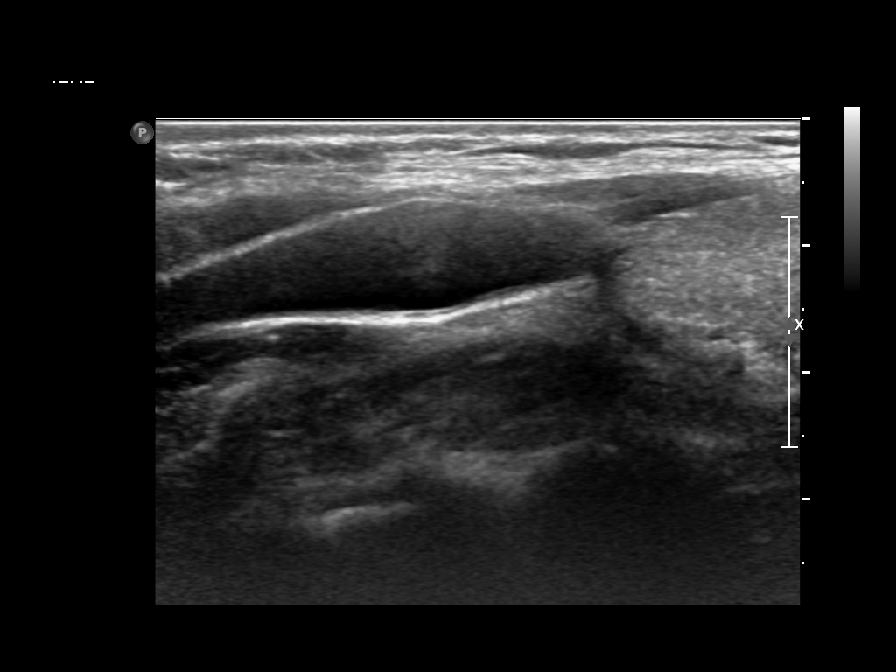
[im 5/9]
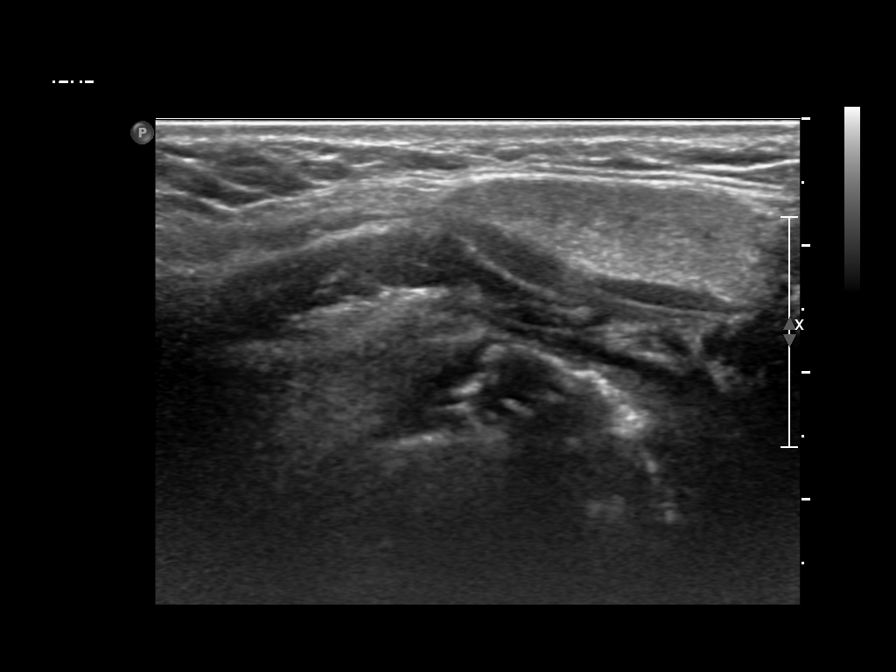
[im 6/9]
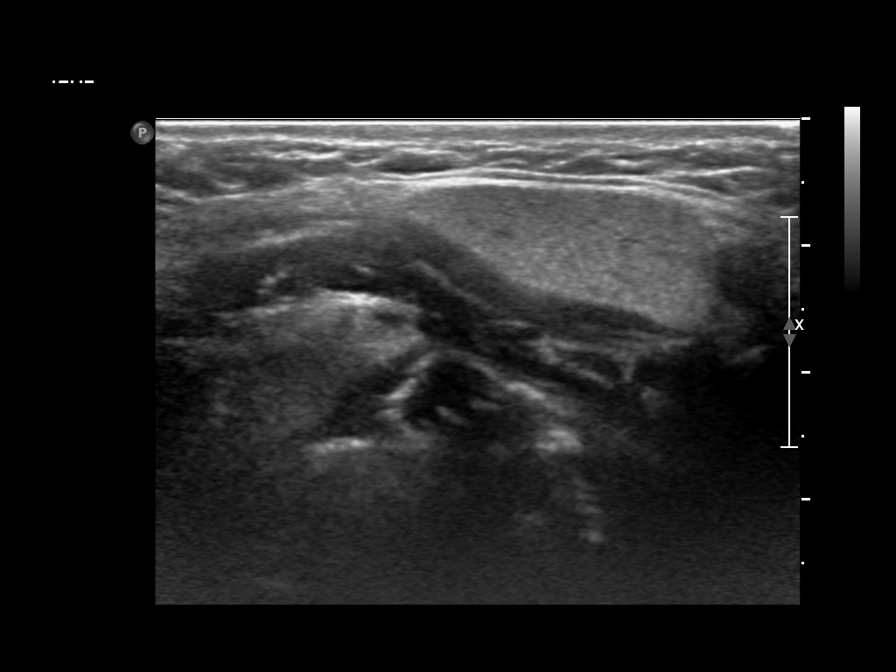
[im 7/9]
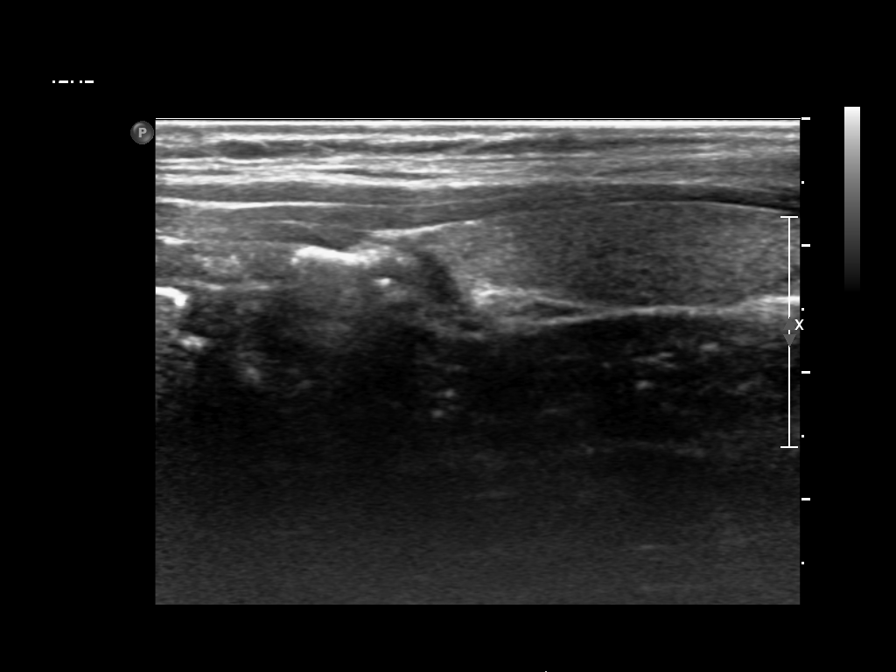
[im 8/9]
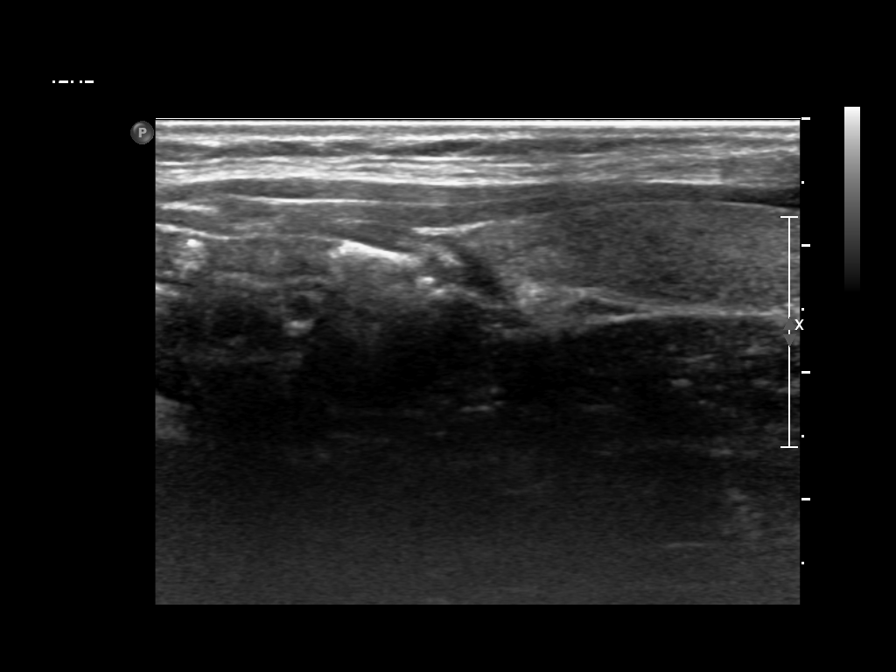
[im 9/9]
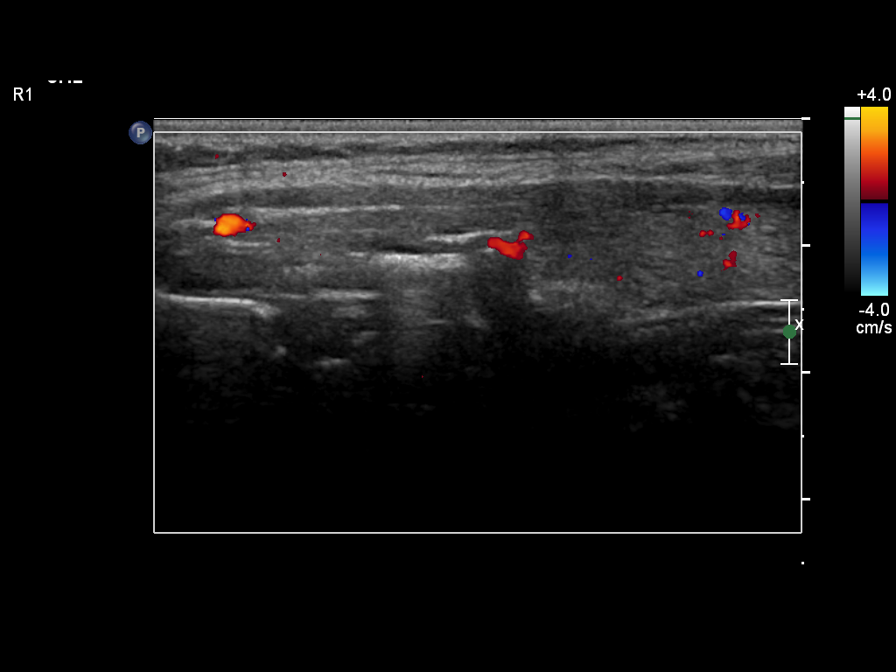

[9 of 9 positions shown; findings below may reference images not displayed]

FINDINGS: No soft tissue abnormality detected.
IMPRESSION: No abnormalities detected.

## 2020-08-03 IMAGING — DX XR CHEST 1 VIEW
1 series · 1 of 1 positions shown · non-contrast
Comparison: none

Exam: Chest one view
Today's exam is compared to the previous study of 06/06/2020.
Heart is normal in size. Mediastinal contours within normal limits. Trachea is midline. Lungs are clear bilaterally. Bony structures are grossly intact.

[AP]
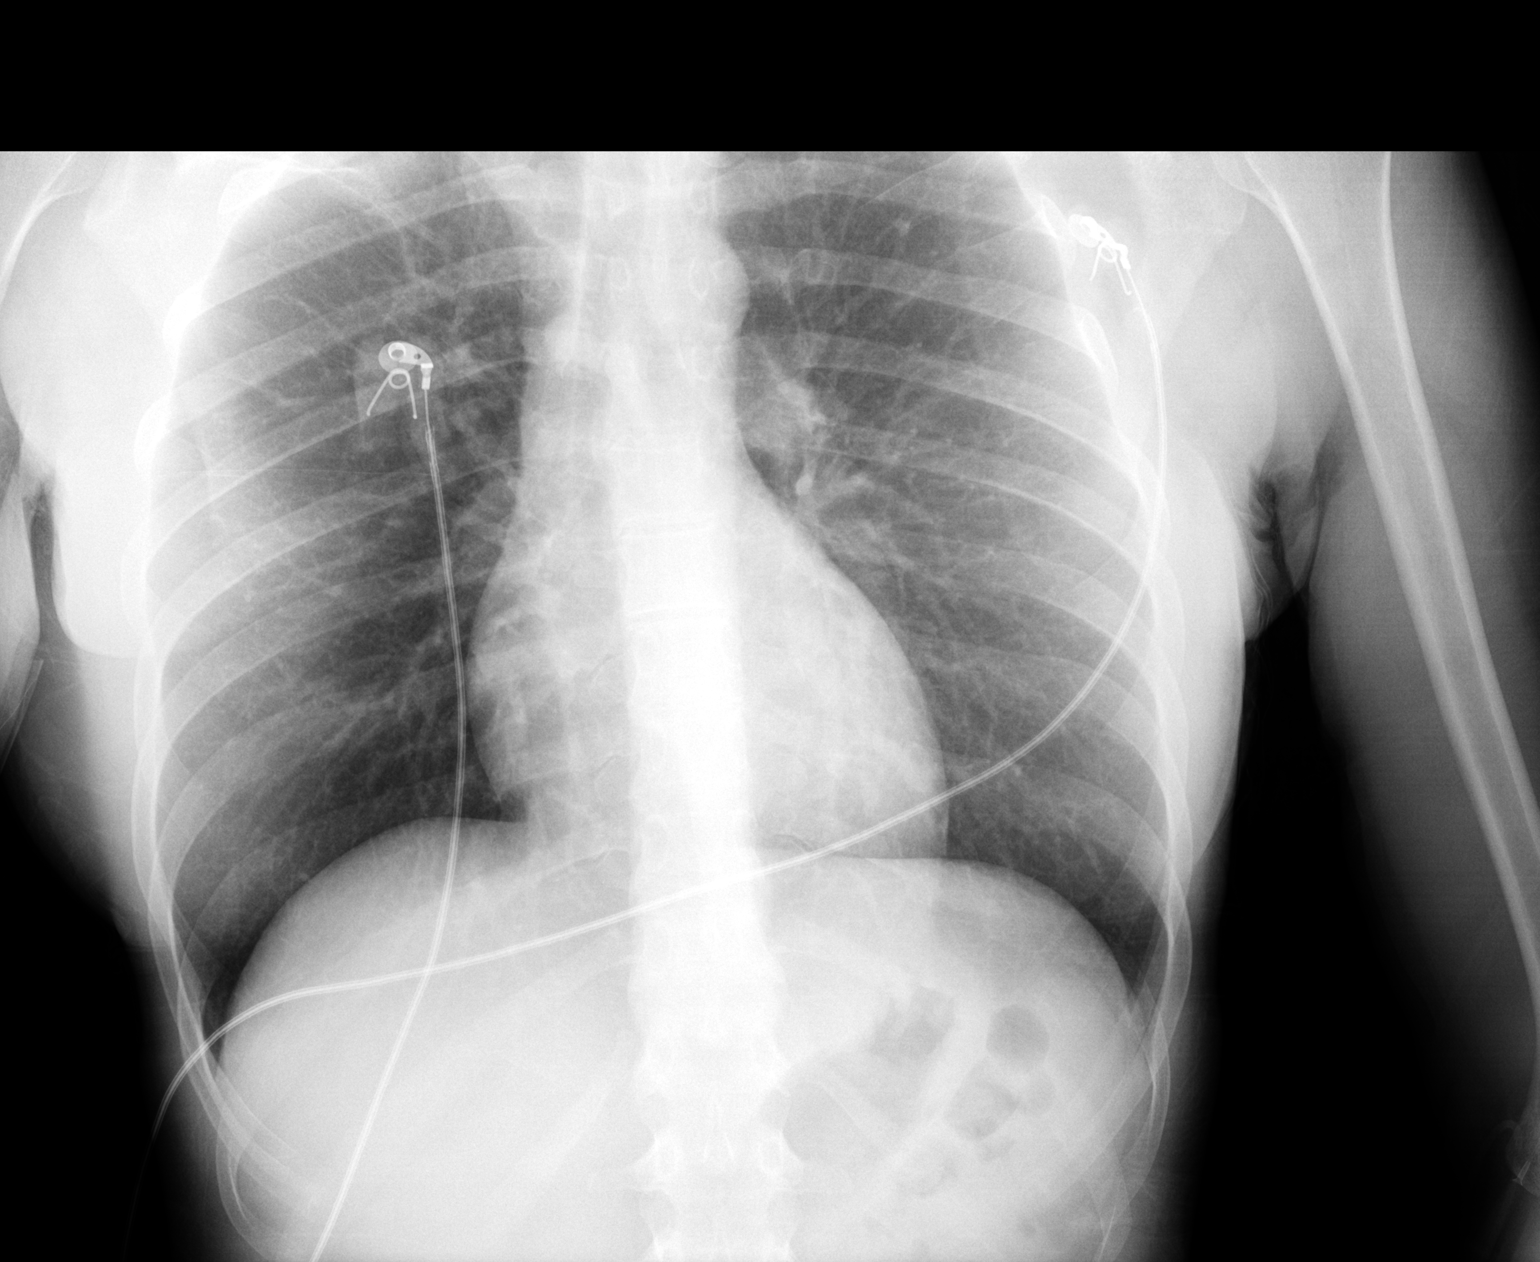

[1 of 1 positions shown; findings below may reference images not displayed]

IMPRESSION: No acute cardia pulmonary process.
Is the patient pregnant?
No

## 2022-06-17 IMAGING — MR MRI CERVICAL SPINE WITHOUT CONTRAST
7 series · 48 of 48 positions shown · non-contrast
Comparison: None available

FINAL REPORT:
HISTORY: Shoulder pain
Procedure: MRI of the cervical spine without contrast
TECHNIQUE: Multisequence, multiplanar imaging of the cervical spine was performed.

[Series 3: survey_mpr_sag · sagittal · 1.7mm · 1.67mm/px · 5 of 15 slices shown]
[im 1/15]
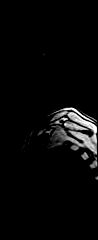
[im 4/15]
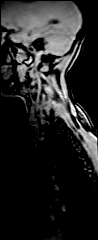
[im 8/15]
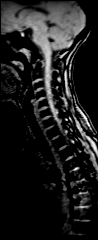
[im 11/15]
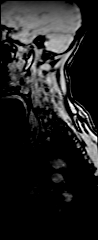
[im 15/15]
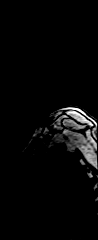

[Series 4: survey_mpr_(person_name) · axial · 1.7mm · 1.67mm/px · z∈[-150,+150]mm · 2 of 7 slices shown]
[im 1/7]
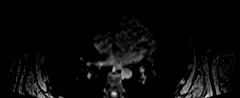
[im 7/7]
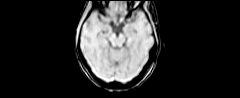

[Series 5: STIR · sagittal · 3.0mm · 0.86mm/px · 5 of 15 slices shown]
[im 1/15]
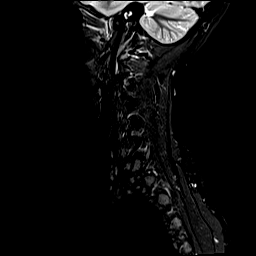
[im 4/15]
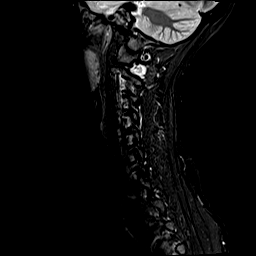
[im 8/15]
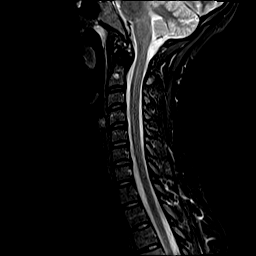
[im 11/15]
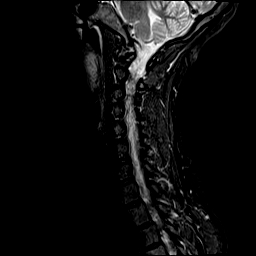
[im 15/15]
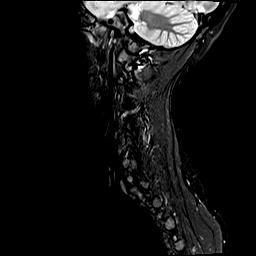

[Series 6: T2 · sagittal · 3.0mm · 0.57mm/px · 5 of 15 slices shown (1 of 2)]
[im 1/15]
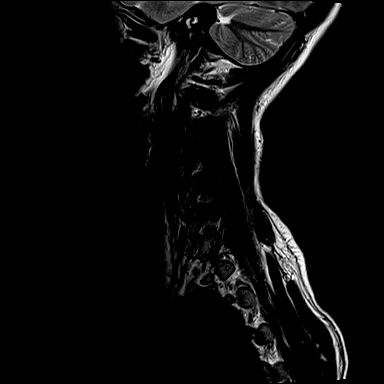
[im 4/15]
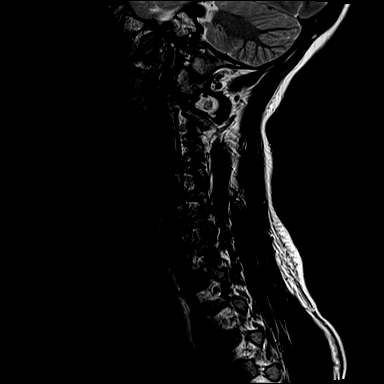
[im 8/15]
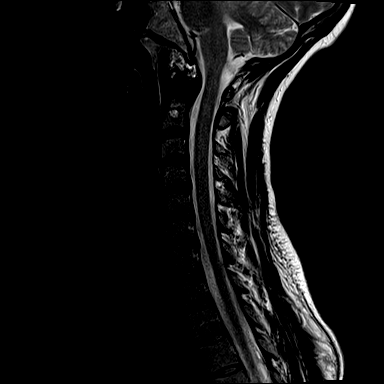
[im 11/15]
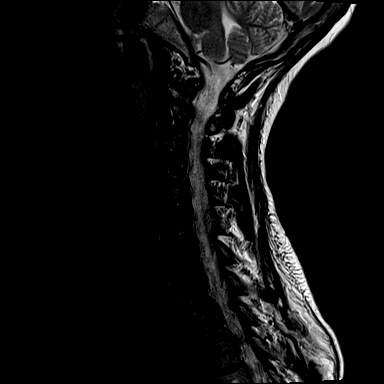
[im 15/15]
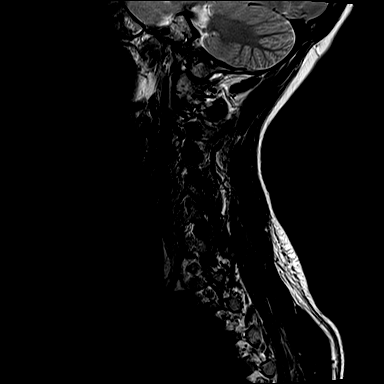

[Series 7: T1 · sagittal · 3.0mm · 0.69mm/px · 5 of 15 slices shown]
[im 1/15]
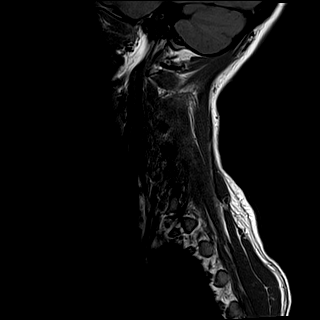
[im 4/15]
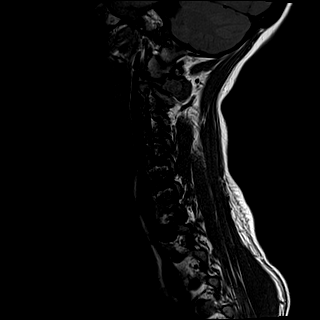
[im 8/15]
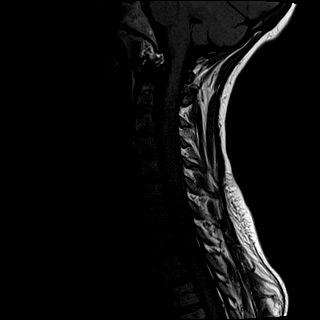
[im 11/15]
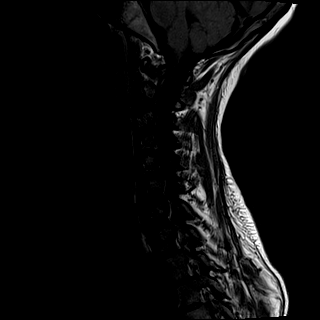
[im 15/15]
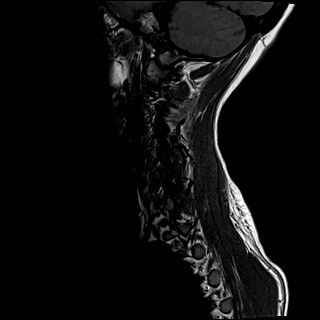

[Series 8: GRE · axial · 3.0mm · 0.70mm/px · z∈[-39,+60]mm · 10 of 32 slices shown]
[im 1/32]
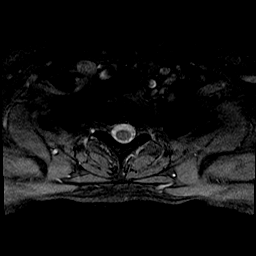
[im 4/32]
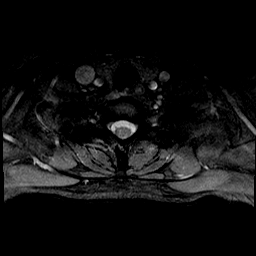
[im 7/32]
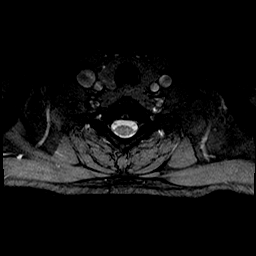
[im 11/32]
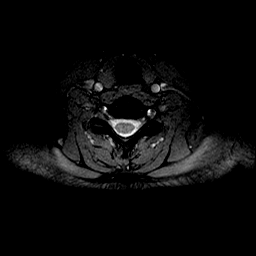
[im 14/32]
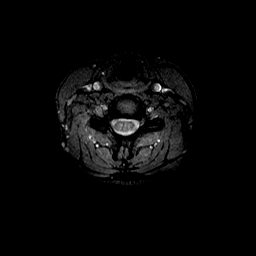
[im 18/32]
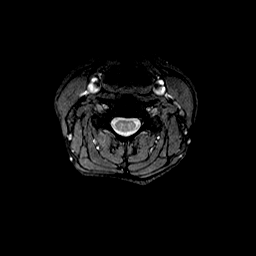
[im 21/32]
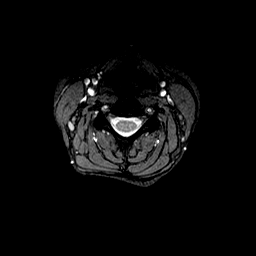
[im 25/32]
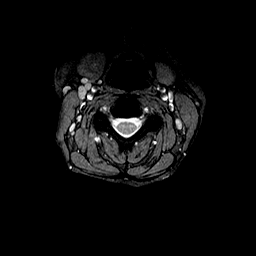
[im 28/32]
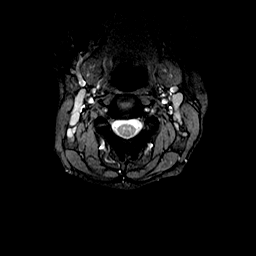
[im 32/32]
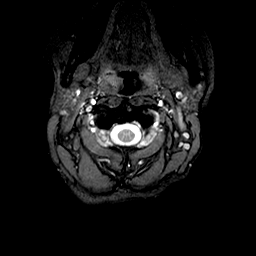

[Series 9: T2 · axial · 2.0mm · 0.70mm/px · z∈[-30,+61]mm · 16 of 48 slices shown (2 of 2)]
[im 1/48]
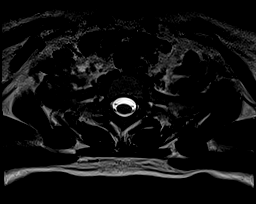
[im 4/48]
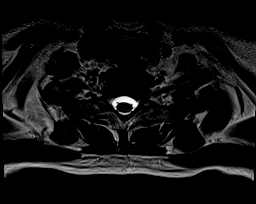
[im 7/48]
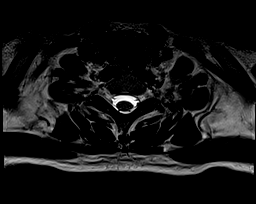
[im 10/48]
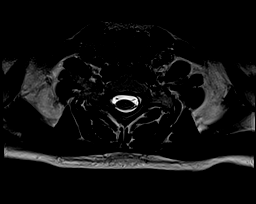
[im 13/48]
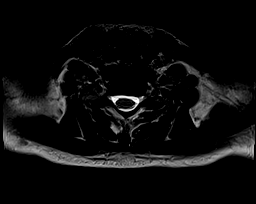
[im 16/48]
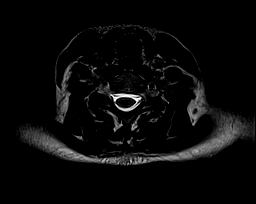
[im 19/48]
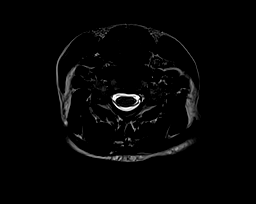
[im 22/48]
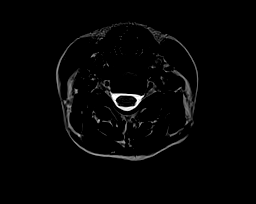
[im 26/48]
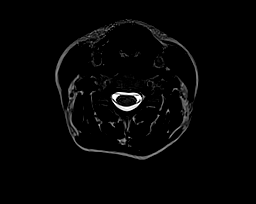
[im 29/48]
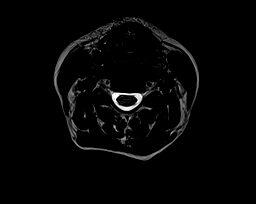
[im 32/48]
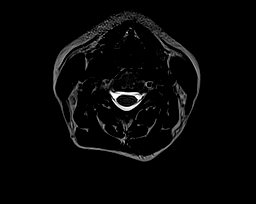
[im 35/48]
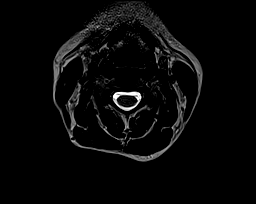
[im 38/48]
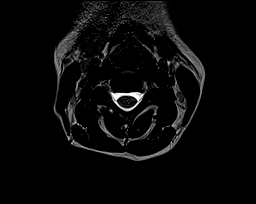
[im 41/48]
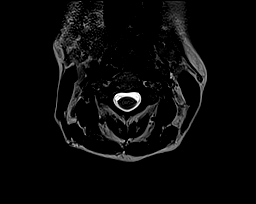
[im 44/48]
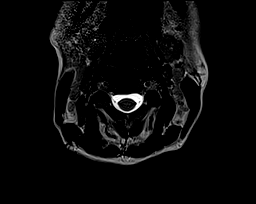
[im 48/48]
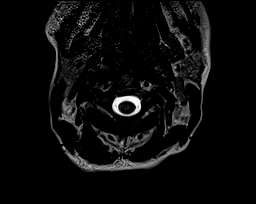

[48 of 48 positions shown; findings below may reference images not displayed]

FINDINGS: Tornwaldt cyst in the midline. Cerebellar tonsils are not ectopic. Cervical cord normal in course, caliber, and signal intensity. No aggressive marrow signal abnormality is seen. Degenerative change or an C2 is noted. Prevertebral and paraspinal soft tissues are unremarkable.
C2-3: No significant spinal stenosis or neural foraminal narrowing.
C3-4: No significant spinal stenosis or neural foraminal narrowing.
C4-5: Small disc bulge with no spinal stenosis or neural foraminal narrowing
C5-C6: No significant spinal stenosis or neural foraminal narrowing.
C6-7: Disc bulge with no spinal stenosis or neural foraminal narrowing
C7-T1: Degenerative changes with spinal stenosis and neural foraminal narrowing as described above level by level
IMPRESSION: 
IMPRESSION: Degenerative changes with spinal stenosis and neural foraminal narrowing as described above level by level. No acute findings seen
Is the patient pregnant?
No

## 2023-01-28 IMAGING — US US PELVIS TRANSVAGINAL
1 series · 14 of 25 positions shown · non-contrast
Comparison: none

[Series 1: us pelvis transvaginal · 14 of 29 slices shown]
[im 1/29]
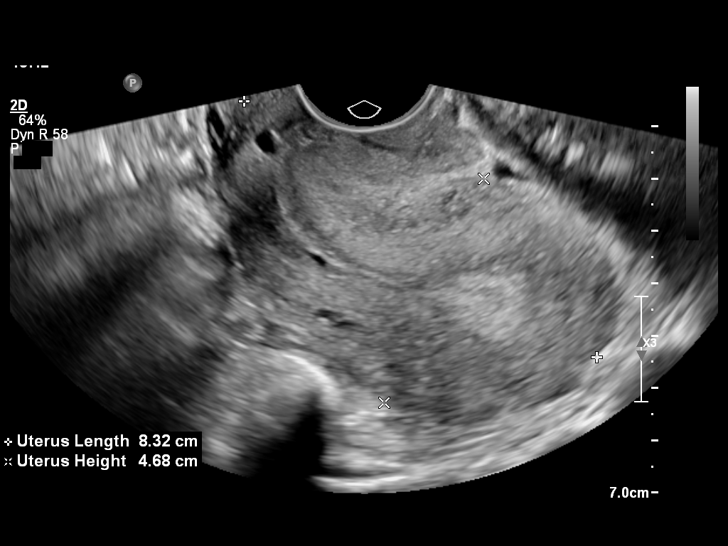
[im 3/29]
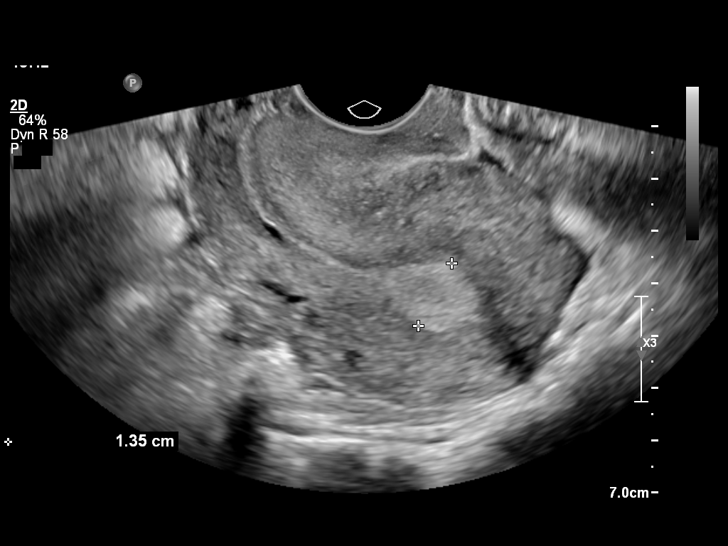
[im 5/29]
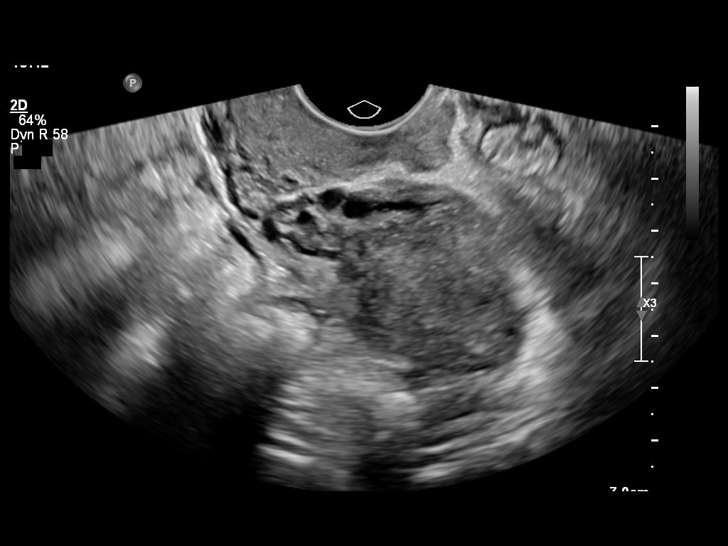
[im 8/29]
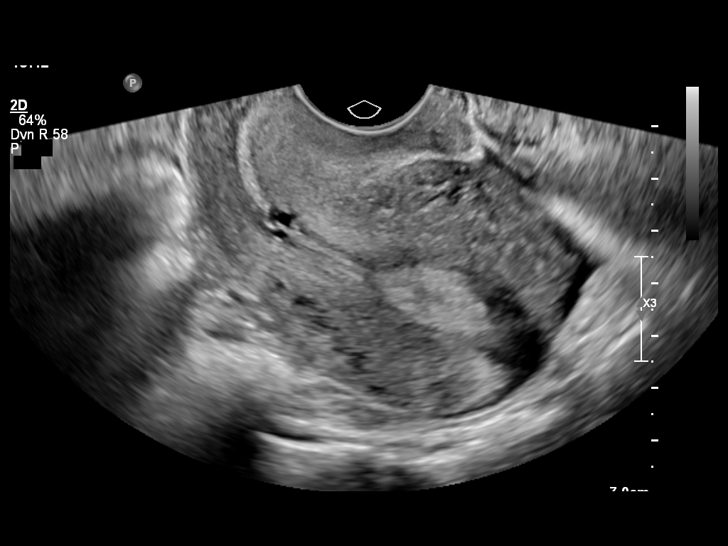
[im 10/29]
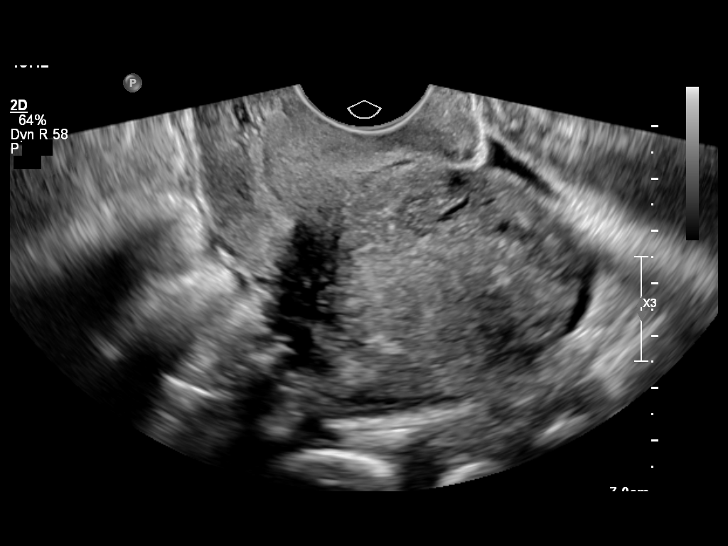
[im 11/29]
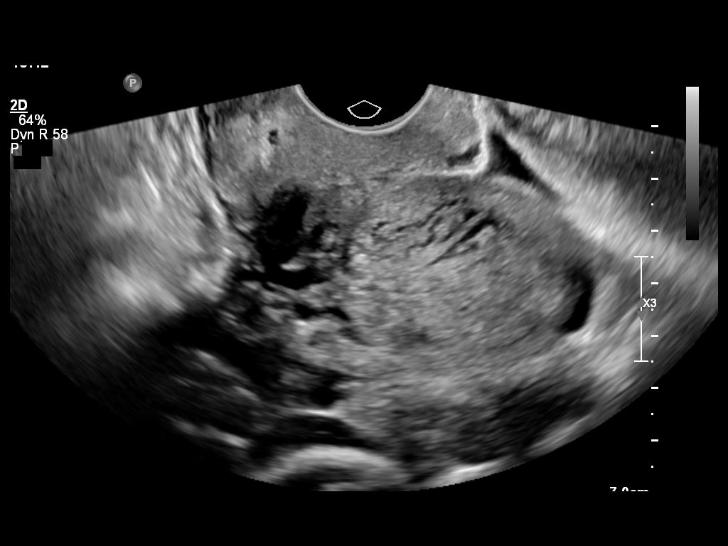
[im 13/29]
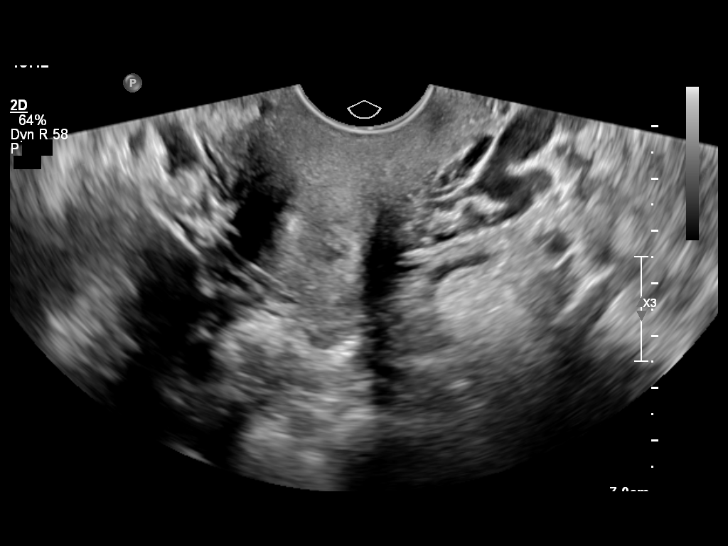
[im 16/29]
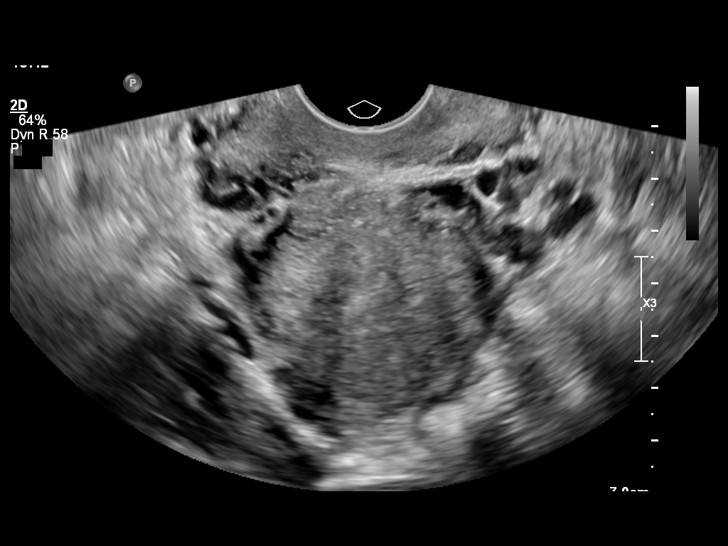
[im 18/29]
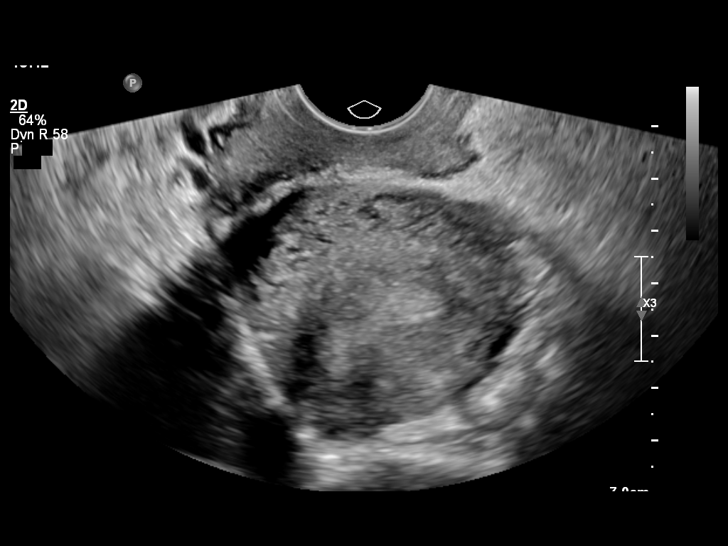
[im 19/29]
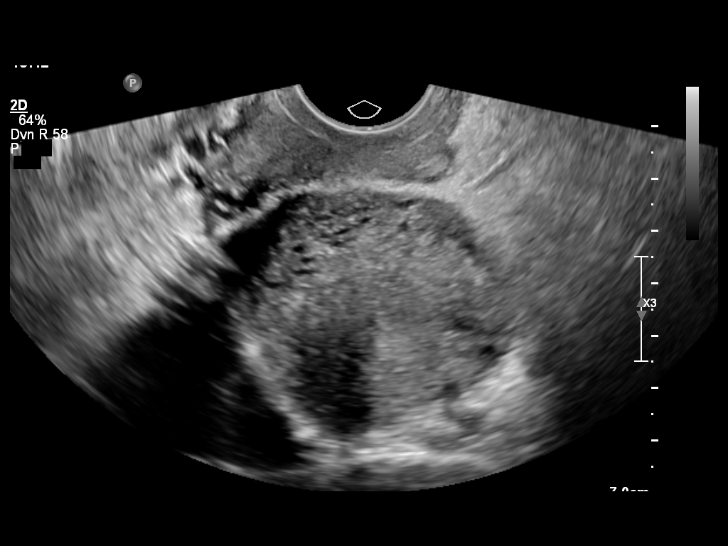
[im 22/29]
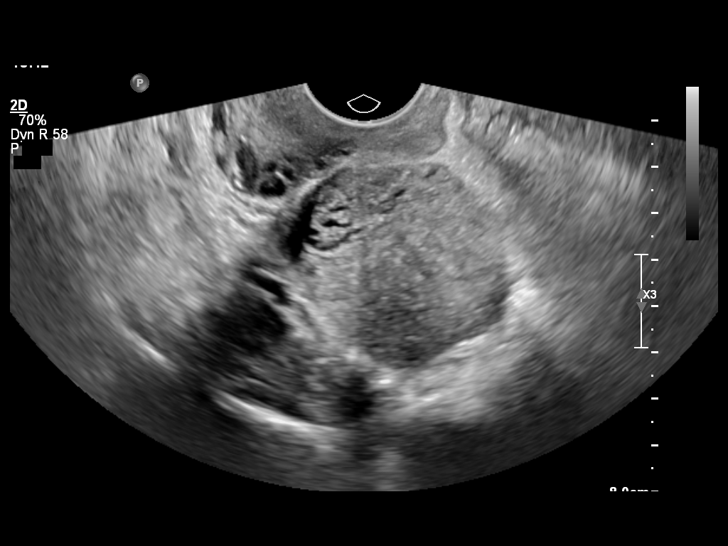
[im 24/29]
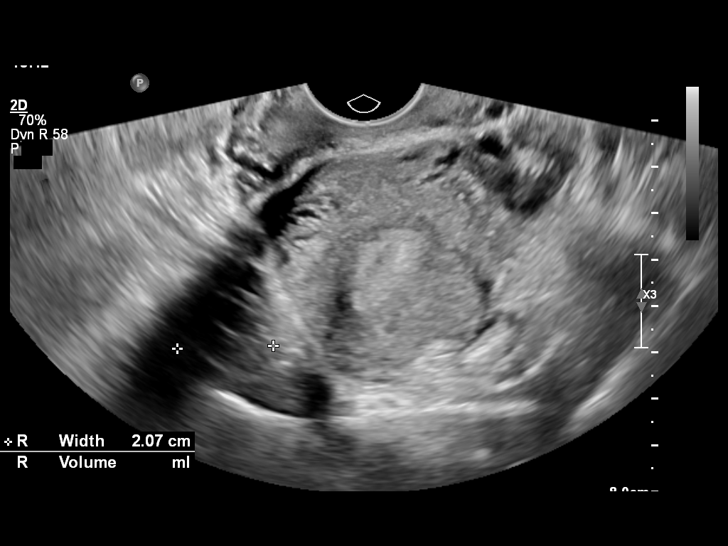
[im 26/29]
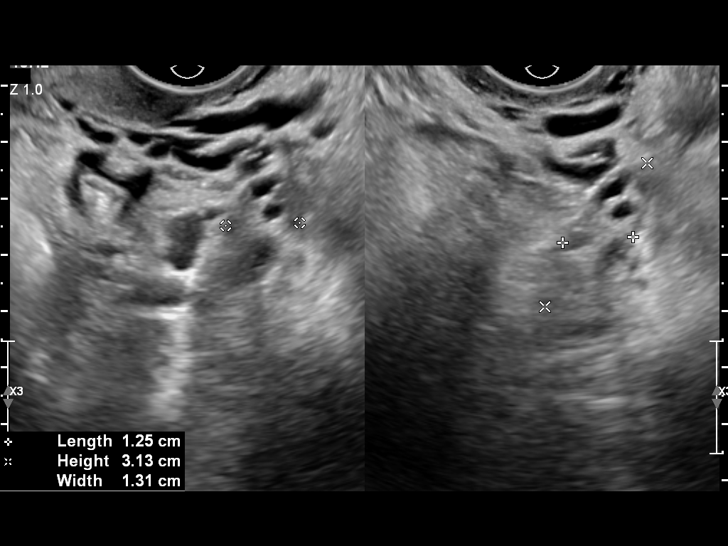
[im 29/29]
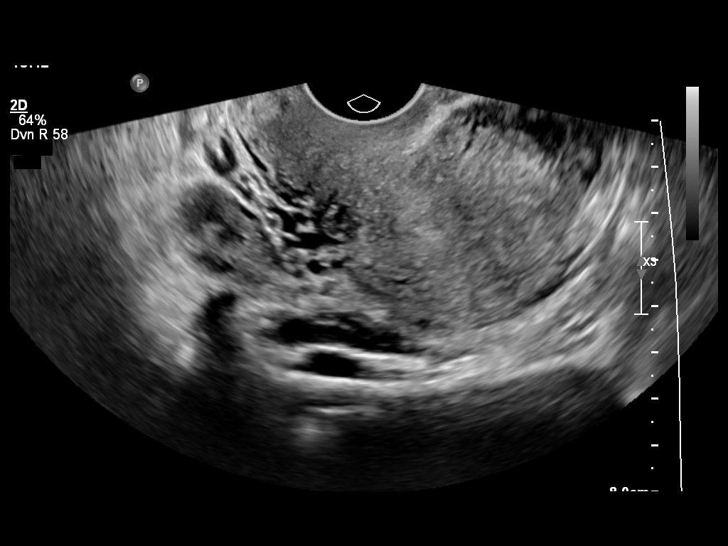

[14 of 25 positions shown; findings below may reference images not displayed]

This is a summary report. The complete report is available in the patient's medical record. If you cannot access the medical record, please contact the sending organization for a detailed fax or copy.
Retroverted Uterus appears normal measuring  8.32 x 4.68 x 5.46cm  volume  111.32ml
Endometrium measures 1.35cm
Right ovary appears normal  4.56 x 2.07 x 2.10cm
Left ovary appears normal 1.25 x 1.31 x 3.1cm
Cervix 2.97cm
No fluid seen in the pelvis
[place], Fredy

## 2023-03-11 IMAGING — CT CT ABDOMEN PELVIS WITH CONTRAST
2 of 3 series · 17 of 46 positions shown, 19 images · IV contrast (agent unspecified)
Comparison: 03/03/2022

SEVERE ABDOMINAL PAIN S/P HYSTERECTOMY ON 03/08/23
FINAL REPORT:
INDICATION: Post operative pain s/p hysterectomy on Monday, 03/08/2023
EXAM: CT of the abdomen and pelvis with contrast
TECHNIQUE: Multiplanar imaging was reconstructed through the abdomen and pelvis.
All CT scans at this facility use iterative reconstruction technique, dose modulation and/or weight based dosing when appropriate to reduce radiation dose to as low as reasonably achievable.

[Series 2: abd/pel · axial · 0.68mm/px · z∈[-452,-80]mm · 14 of 173 slices shown, 16 images]
[im 12/173  soft-tissue]
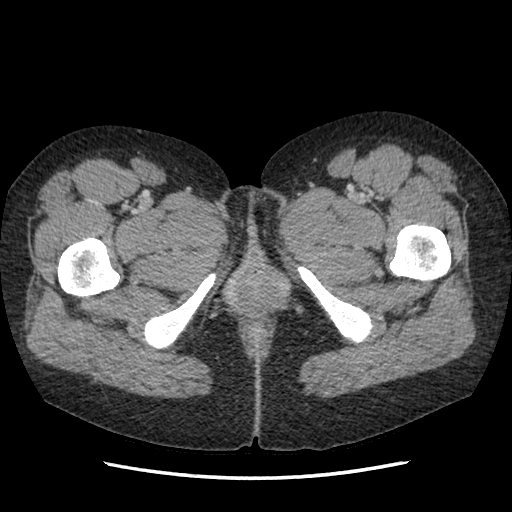
[im 12/173  bone]
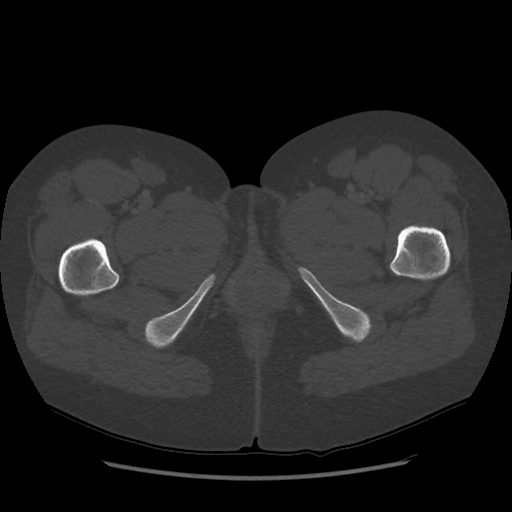
[im 23/173  soft-tissue]
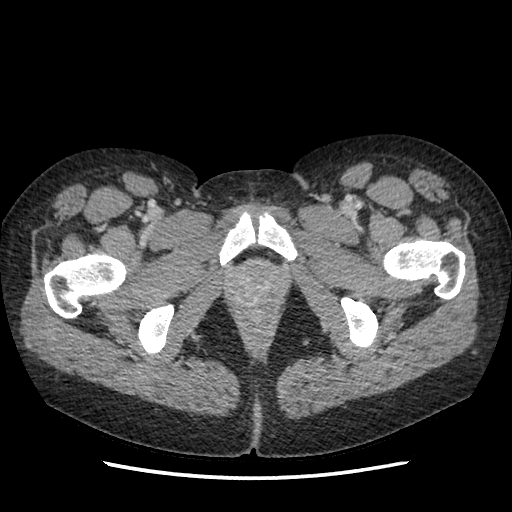
[im 34/173  soft-tissue]
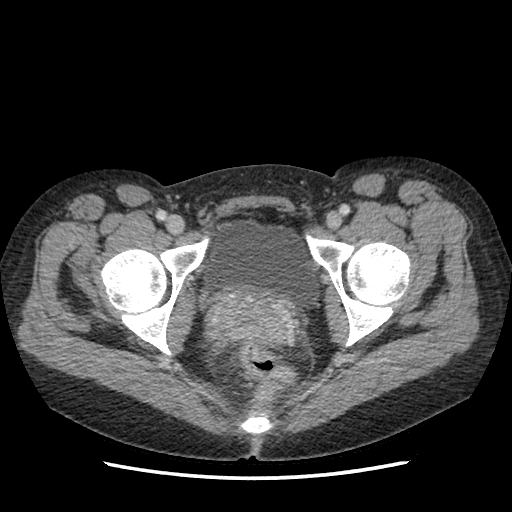
[im 45/173  soft-tissue]
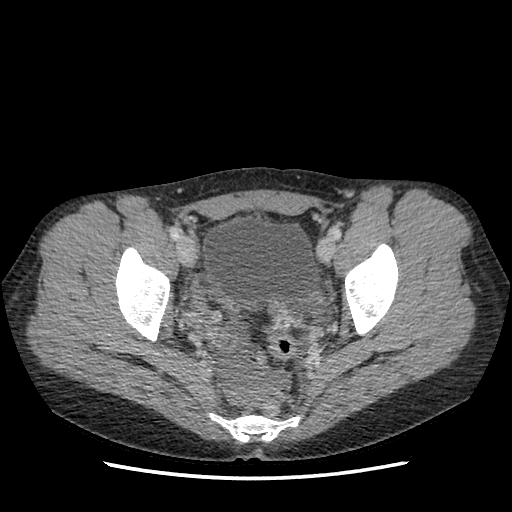
[im 56/173  soft-tissue]
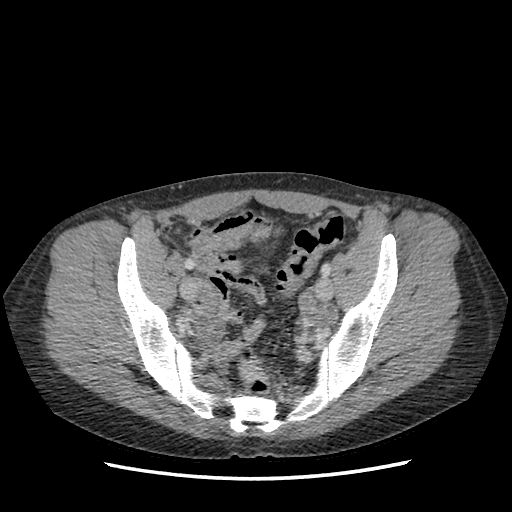
[im 67/173  soft-tissue]
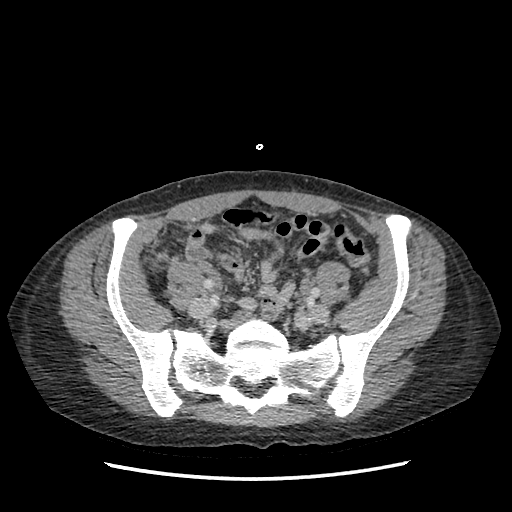
[im 78/173  soft-tissue]
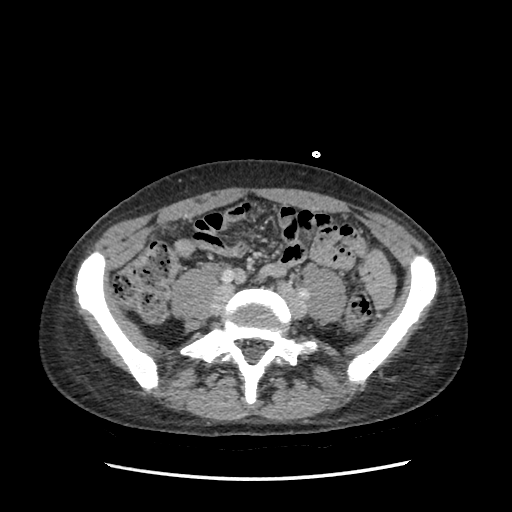
[im 95/173  soft-tissue]
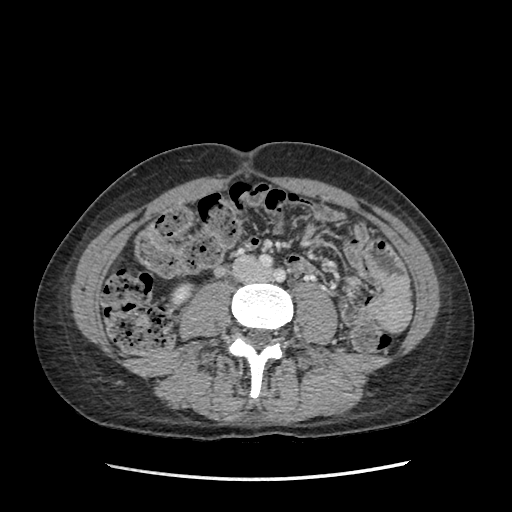
[im 106/173  soft-tissue]
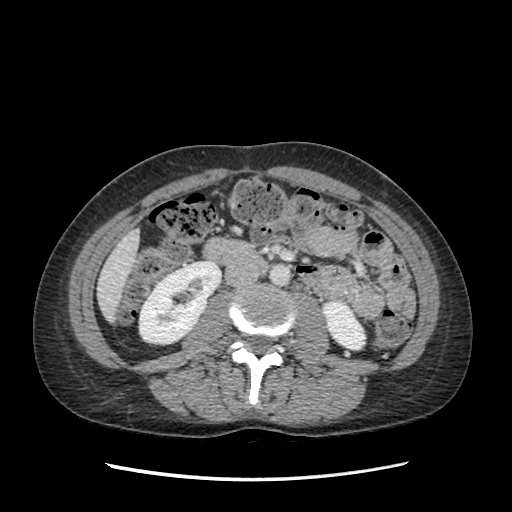
[im 106/173  bone]
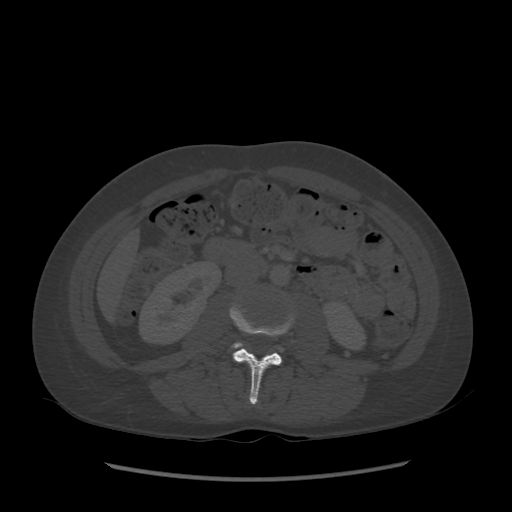
[im 117/173  soft-tissue]
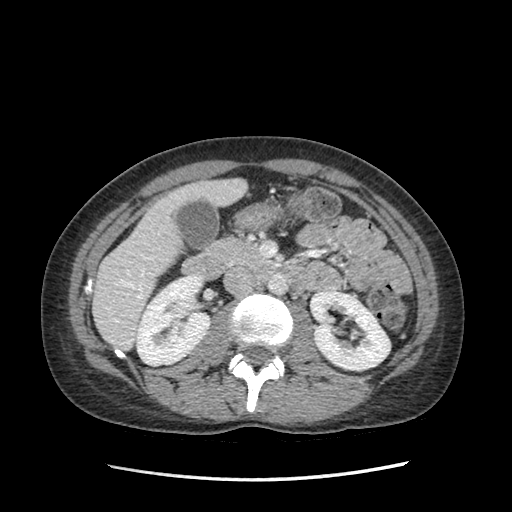
[im 128/173  soft-tissue]
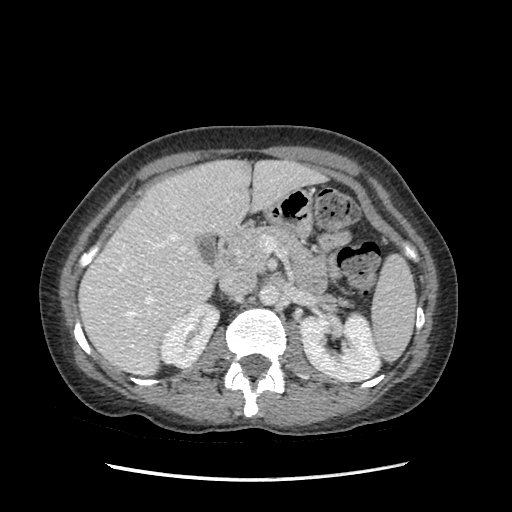
[im 139/173  soft-tissue]
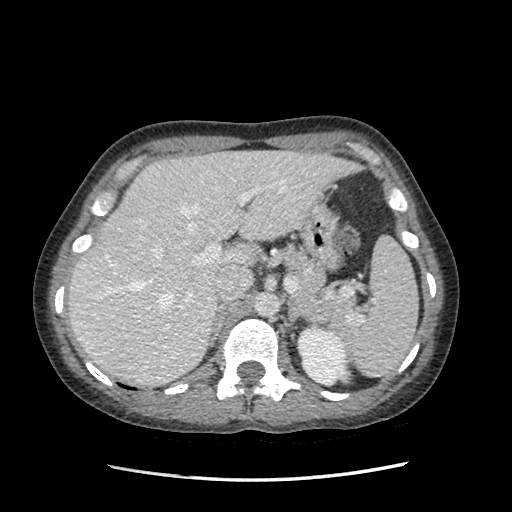
[im 150/173  soft-tissue]
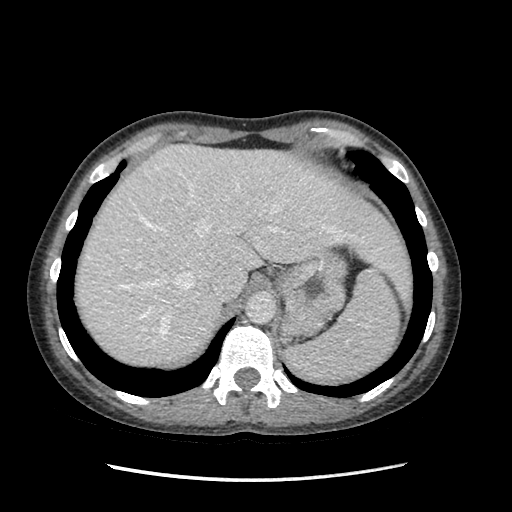
[im 161/173  soft-tissue]
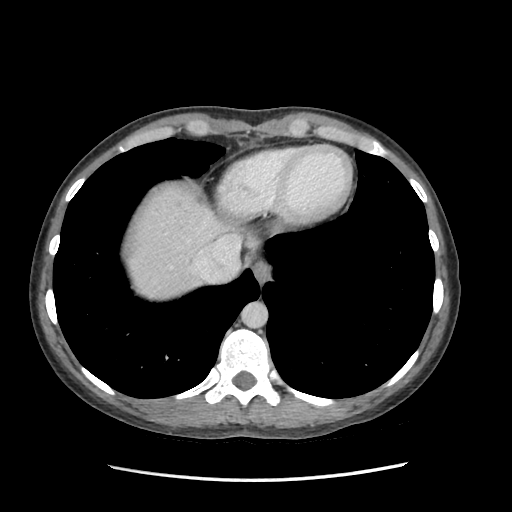

[Series 602: sag standard 2x2 · sagittal · 0.84mm/px · 3 of 176 slices shown]
[im 59/176  soft-tissue]
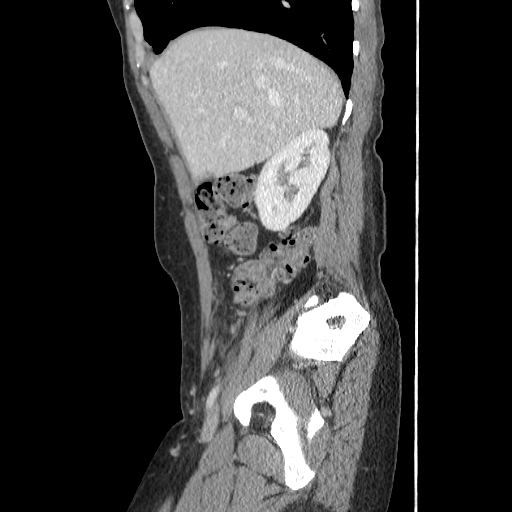
[im 78/176  soft-tissue]
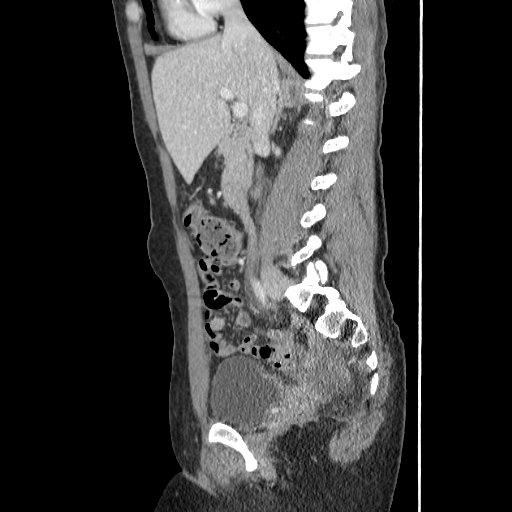
[im 98/176  soft-tissue]
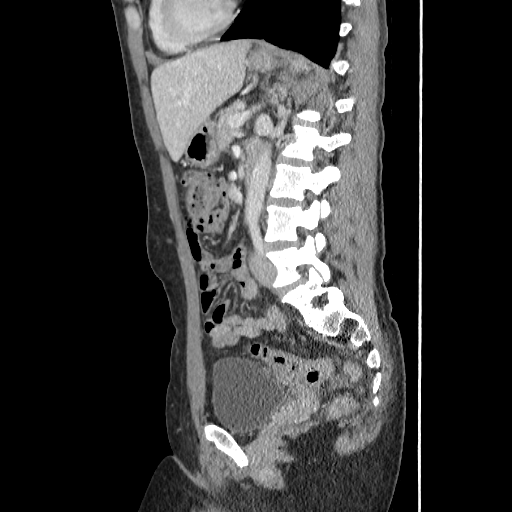

[17 of 46 positions shown; findings below may reference images not displayed]

FINDINGS: Lung bases are clear. The heart isn't enlarged. There is normal enhancement of the liver. The spleen is unremarkable. The gallbladder is normal. The pancreas is normal. The adrenal glands are normal. The kidneys show symmetric enhancement. There is no mesenteric or retroperitoneal lymphadenopathy. No deep pelvic or inguinal lymphadenopathy. Bladder is unremarkable. Uterus is surgically absent. There is pelvic fluid. This measures 5.0 x 4.0 cm. There is a crescent of dependent high density. This is likely blood products. There is no distinct peripheral enhancing rim. The colon is unremarkable. The appendix is not enlarged. There is minimal pelvic reticulation likely postsurgical change. There is no small bowel dilation. Stomach and duodenum are unremarkable. No aggressive bone lesions are seen. No free air is demonstrated. Splenic, mesenteric, and portal veins are patent. Aorta is normal in caliber.
IMPRESSION: 
IMPRESSION: 1. Interval hysterectomy with pelvic fluid collection measuring 5 x 4 cm. Given the appearance, hematoma or seroma is favored over abscess. Pelvic ultrasound can be performed as clinically warranted.
2. No other significant findings

## 2024-04-06 IMAGING — CT CT CHEST PULMONARY EMBOLISM WITH IV CONTRAST
2 of 7 series · 19 of 36 positions shown · IV contrast (agent unspecified)
Comparison: 08/03/2020

FINAL REPORT:
CT CHEST PULMONARY EMBOLISM WITH IV CONTRAST
Pulmonary embolism (PE) suspected, high prob
INDICATION: Pulmonary embolism (PE) suspected, high prob.
TECHNIQUE: Jsovue-VAX was injected without complications. The chest was imaged
using pulmonary embolism CT angiography protocol. Coronal and sagittal images
were acquired as reformats. MIP reformats were also obtained. All CT scans at
this facility use iterative reconstruction technique, automated exposure
control, and/or weight based dosing when appropriate to reduce radiation dose.

[Series 4: pe chest thin · axial · 0.77mm/px · z∈[-264,+28]mm · 16 of 532 slices shown]
[im 32/532  lung]
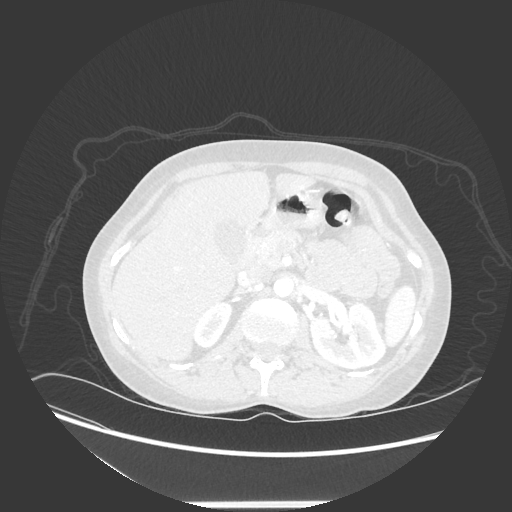
[im 63/532  mediastinal]
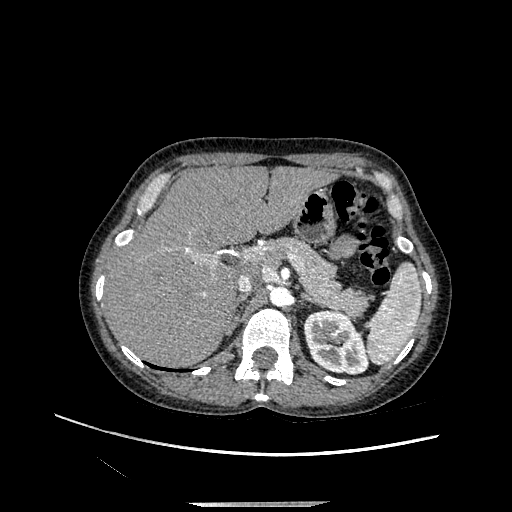
[im 94/532  lung]
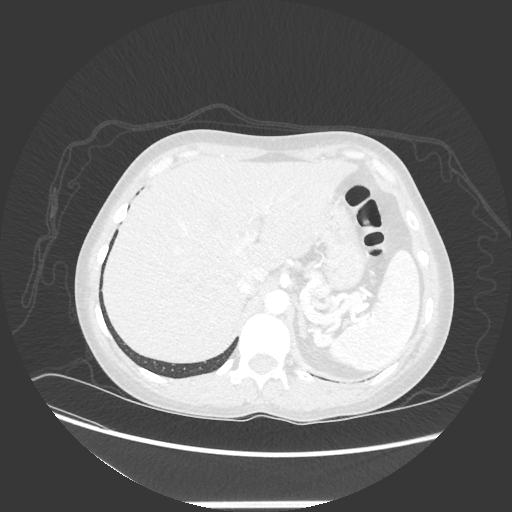
[im 125/532  mediastinal]
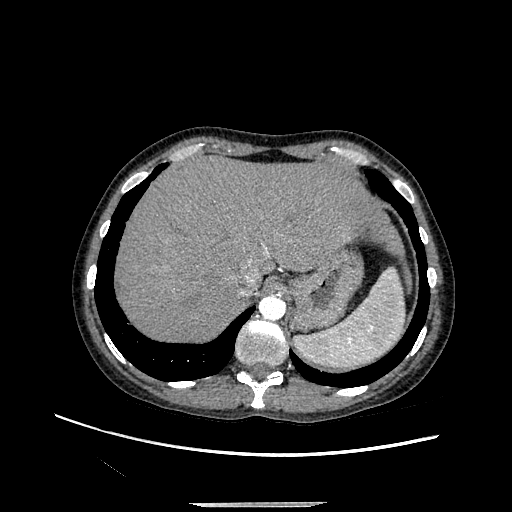
[im 157/532  lung]
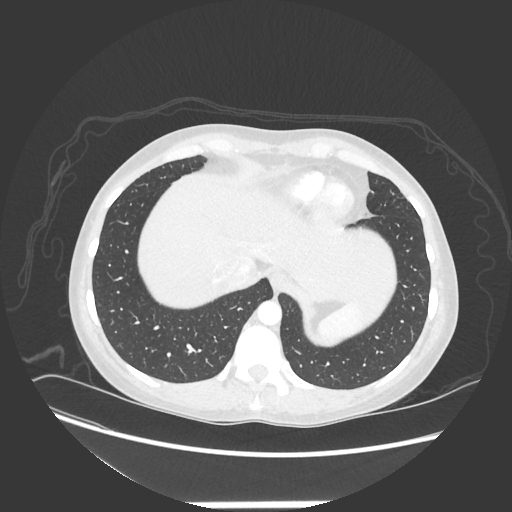
[im 188/532  mediastinal]
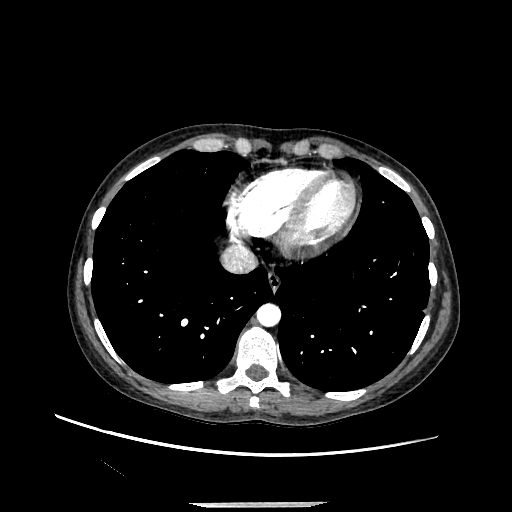
[im 219/532  lung]
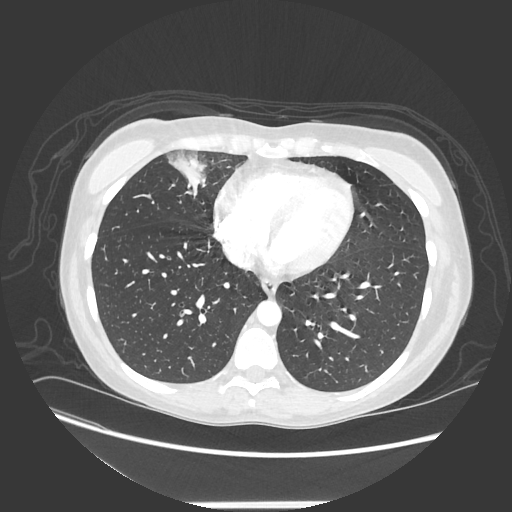
[im 250/532  mediastinal]
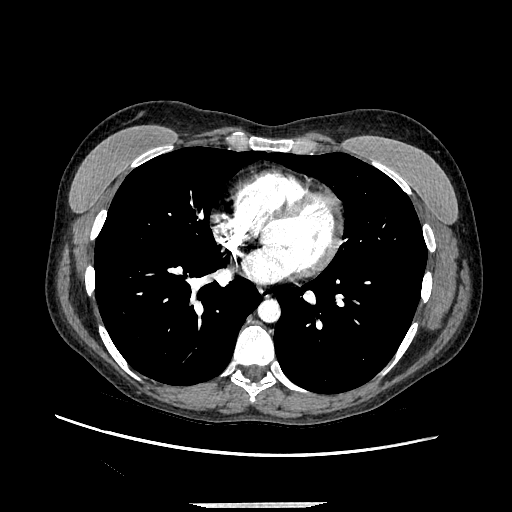
[im 282/532  lung]
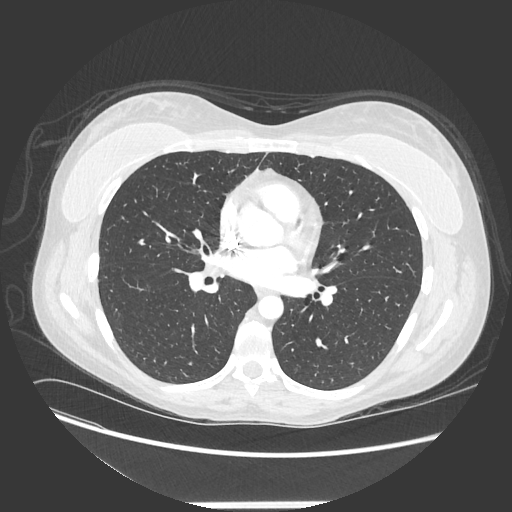
[im 313/532  mediastinal]
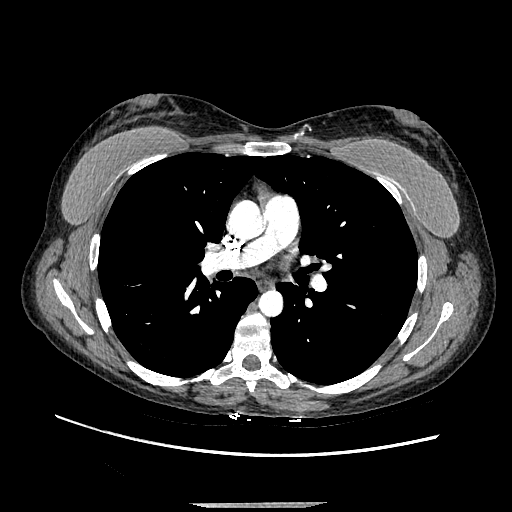
[im 344/532  lung]
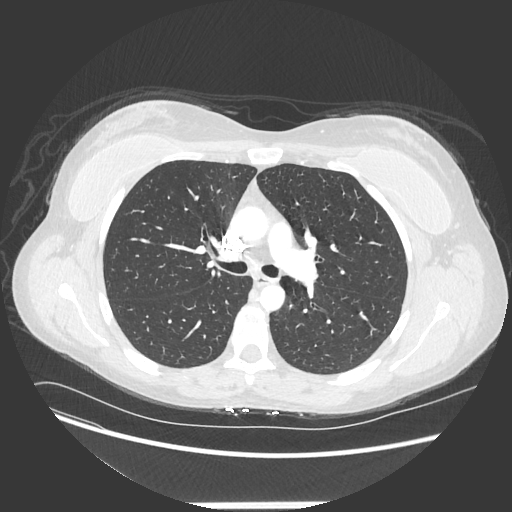
[im 375/532  mediastinal]
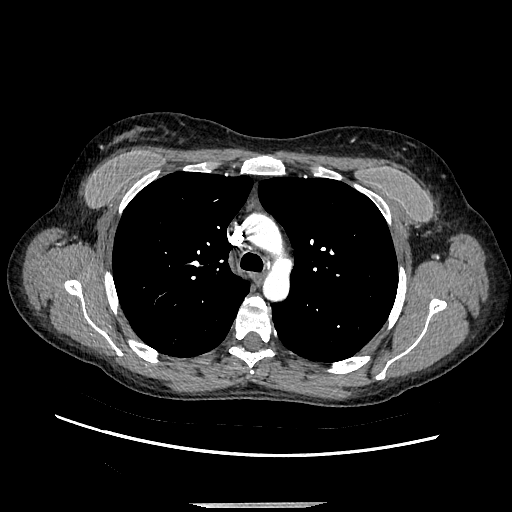
[im 407/532  lung]
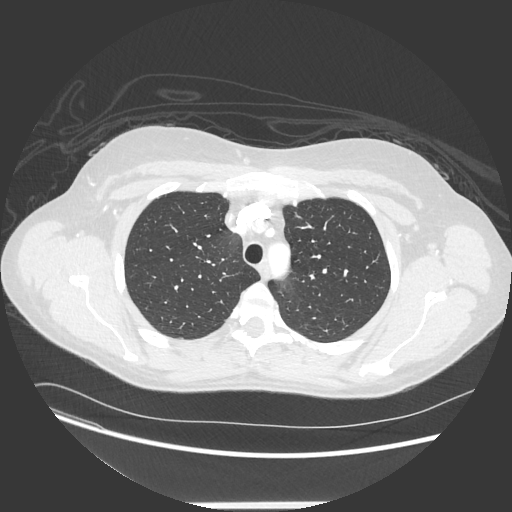
[im 438/532  mediastinal]
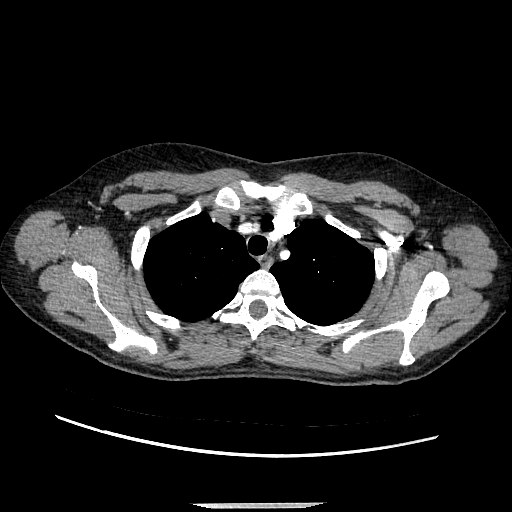
[im 469/532  lung]
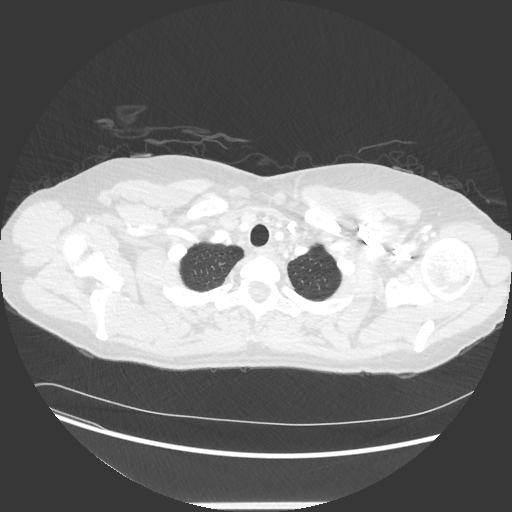
[im 500/532  mediastinal]
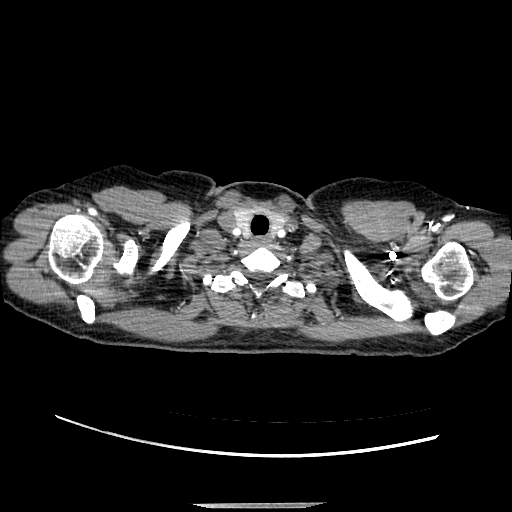

[Series 601: sag standard 2x2 · sagittal · 0.77mm/px · 3 of 198 slices shown]
[im 40/198  mediastinal]
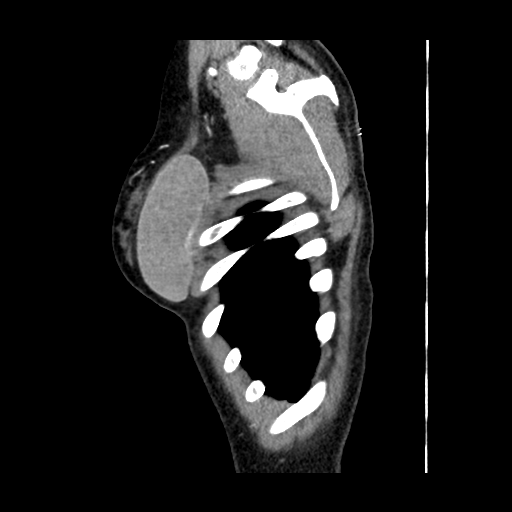
[im 79/198  mediastinal]
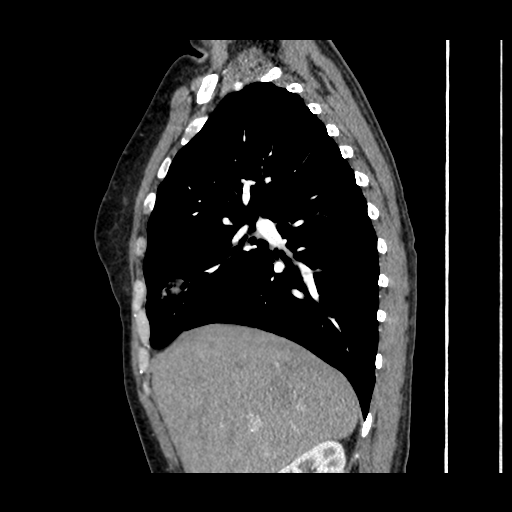
[im 119/198  mediastinal]
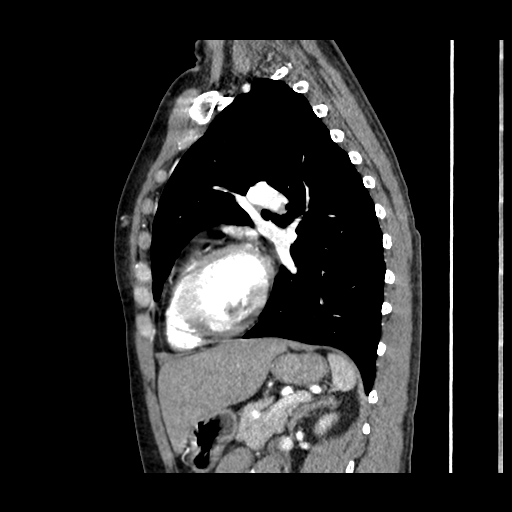

[19 of 36 positions shown; findings below may reference images not displayed]

FINDINGS: Pulmonary arteries: Evaluation is slightly limited by timing of contrast, within
this limitation no definite pulmonary embolism visualized.
Mediastinum: Normal heart size. No pericardial effusion. Aorta is normal
caliber.
Lymph nodes: No pathologically enlarged thoracic lymph nodes.
Airways: Central airways are patent.
Lungs/Pleura: Right middle lobe wedge-shaped consolidation. No pleural effusion.
No pneumothorax. Right lower lobe 6 mm nodule ([DATE]), unchanged since 8888.
Upper abdomen: No acute abnormality.
Bones and soft tissues: No acute osseous abnormality. Bilateral breast implants
in place
IMPRESSION: 1.  No definite pulmonary embolism visualized within the limitations of the
exam.
2.  Right middle lobe wedge-shaped consolidation suspicious for pneumonia.
PMFrom Workstation ID: [id number]
Is the patient pregnant?
No

## 2024-04-06 IMAGING — CR XR CHEST 1 VIEW
1 series · 1 of 1 positions shown · non-contrast
Comparison: None

FINAL REPORT:
Chest portable one view
INDICATION: SOB

[PA]
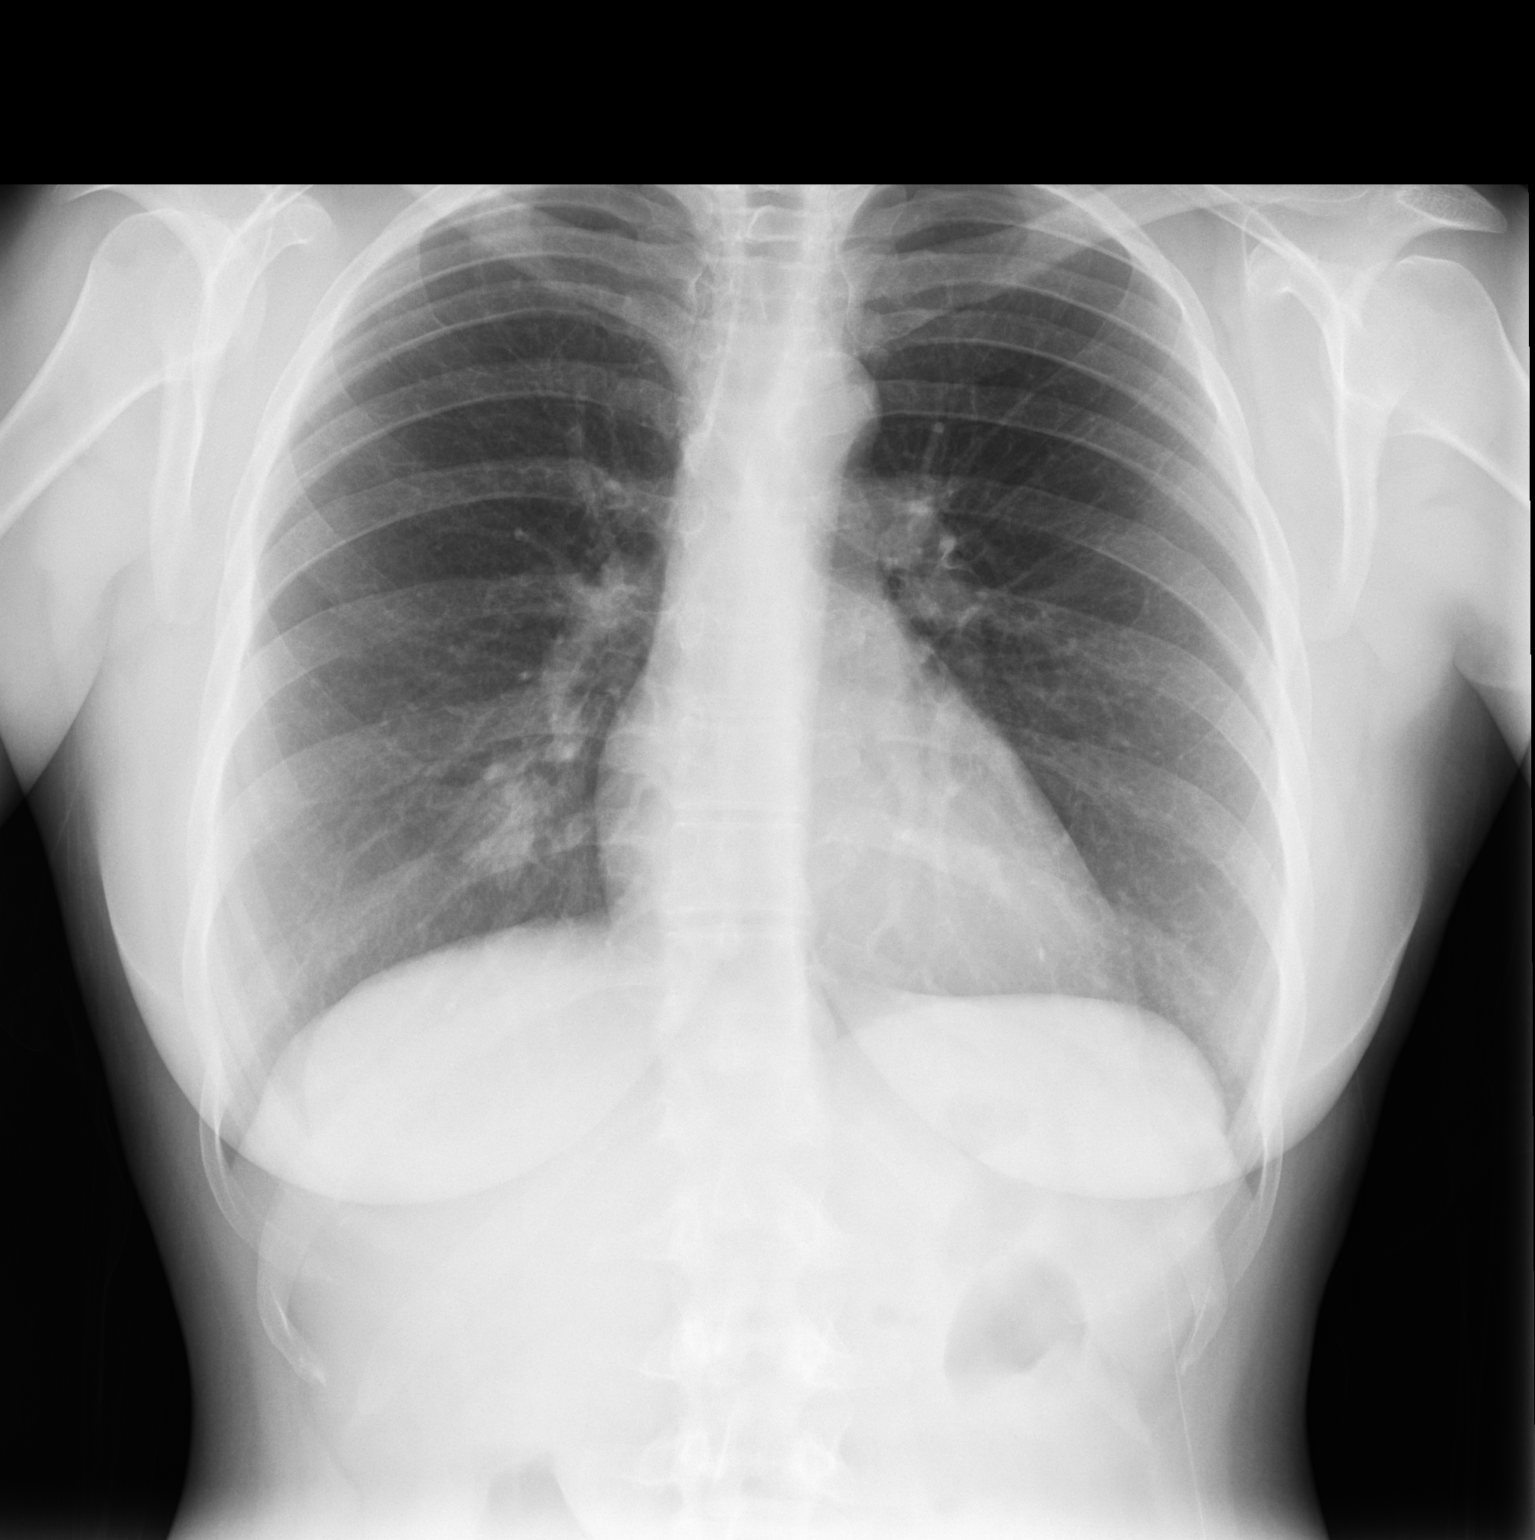

[1 of 1 positions shown; findings below may reference images not displayed]

FINDINGS: The cardiac silhouette is within normal limits given portable technique.
New right basilar consolidation concerning for pneumonia. No pneumothorax. No sizeable pleural effusion.
No acute osseous abnormalities.
IMPRESSION: 
IMPRESSION: New right basilar consolidation concerning for pneumonia.
Is the patient pregnant?
No
